# Patient Record
Sex: Female | Born: 1950 | Race: White | Hispanic: No | Smoking: Never smoker
Health system: Southern US, Community
[De-identification: ages and names within clinical notes are randomized; demographics above are authoritative.]

## PROBLEM LIST (undated history)

## (undated) DIAGNOSIS — M2550 Pain in unspecified joint: Secondary | ICD-10-CM

## (undated) DIAGNOSIS — M1990 Unspecified osteoarthritis, unspecified site: Secondary | ICD-10-CM

## (undated) DIAGNOSIS — J189 Pneumonia, unspecified organism: Secondary | ICD-10-CM

## (undated) DIAGNOSIS — E039 Hypothyroidism, unspecified: Secondary | ICD-10-CM

## (undated) DIAGNOSIS — D649 Anemia, unspecified: Secondary | ICD-10-CM

## (undated) DIAGNOSIS — Z9889 Other specified postprocedural states: Secondary | ICD-10-CM

## (undated) DIAGNOSIS — E7800 Pure hypercholesterolemia, unspecified: Secondary | ICD-10-CM

## (undated) DIAGNOSIS — T8859XA Other complications of anesthesia, initial encounter: Secondary | ICD-10-CM

## (undated) DIAGNOSIS — I10 Essential (primary) hypertension: Secondary | ICD-10-CM

## (undated) DIAGNOSIS — Z87442 Personal history of urinary calculi: Secondary | ICD-10-CM

## (undated) DIAGNOSIS — R112 Nausea with vomiting, unspecified: Secondary | ICD-10-CM

## (undated) DIAGNOSIS — T4145XA Adverse effect of unspecified anesthetic, initial encounter: Secondary | ICD-10-CM

## (undated) DIAGNOSIS — R3915 Urgency of urination: Secondary | ICD-10-CM

## (undated) DIAGNOSIS — R011 Cardiac murmur, unspecified: Secondary | ICD-10-CM

## (undated) DIAGNOSIS — Z8709 Personal history of other diseases of the respiratory system: Secondary | ICD-10-CM

## (undated) HISTORY — PX: TOTAL ABDOMINAL HYSTERECTOMY: SHX209

## (undated) HISTORY — PX: TUBAL LIGATION: SHX77

## (undated) HISTORY — DX: Pure hypercholesterolemia, unspecified: E78.00

## (undated) HISTORY — DX: Essential (primary) hypertension: I10

## (undated) HISTORY — PX: THYROID SURGERY: SHX805

---

## 2005-10-22 ENCOUNTER — Encounter (HOSPITAL_COMMUNITY): Admission: RE | Admit: 2005-10-22 | Discharge: 2005-11-21 | Payer: Self-pay | Admitting: Orthopaedic Surgery

## 2005-11-24 ENCOUNTER — Encounter (HOSPITAL_COMMUNITY): Admission: RE | Admit: 2005-11-24 | Discharge: 2005-12-24 | Payer: Self-pay | Admitting: Orthopaedic Surgery

## 2008-04-07 HISTORY — PX: OTHER SURGICAL HISTORY: SHX169

## 2009-02-27 ENCOUNTER — Ambulatory Visit (HOSPITAL_COMMUNITY): Admission: RE | Admit: 2009-02-27 | Discharge: 2009-02-27 | Payer: Self-pay | Admitting: General Surgery

## 2009-04-07 HISTORY — PX: OTHER SURGICAL HISTORY: SHX169

## 2009-04-07 HISTORY — PX: KIDNEY STONE SURGERY: SHX686

## 2009-07-14 ENCOUNTER — Emergency Department (HOSPITAL_COMMUNITY): Admission: EM | Admit: 2009-07-14 | Discharge: 2009-07-14 | Payer: Self-pay | Admitting: Emergency Medicine

## 2009-07-14 ENCOUNTER — Encounter: Payer: Self-pay | Admitting: Orthopedic Surgery

## 2009-07-16 ENCOUNTER — Ambulatory Visit: Payer: Self-pay | Admitting: Orthopedic Surgery

## 2009-07-16 DIAGNOSIS — S82839A Other fracture of upper and lower end of unspecified fibula, initial encounter for closed fracture: Secondary | ICD-10-CM | POA: Insufficient documentation

## 2009-07-16 DIAGNOSIS — Z8679 Personal history of other diseases of the circulatory system: Secondary | ICD-10-CM | POA: Insufficient documentation

## 2009-07-26 ENCOUNTER — Encounter: Payer: Self-pay | Admitting: Orthopedic Surgery

## 2009-09-10 ENCOUNTER — Ambulatory Visit: Payer: Self-pay | Admitting: Orthopedic Surgery

## 2009-09-10 DIAGNOSIS — M21619 Bunion of unspecified foot: Secondary | ICD-10-CM | POA: Insufficient documentation

## 2009-09-10 DIAGNOSIS — M1710 Unilateral primary osteoarthritis, unspecified knee: Secondary | ICD-10-CM | POA: Insufficient documentation

## 2009-09-10 DIAGNOSIS — Q742 Other congenital malformations of lower limb(s), including pelvic girdle: Secondary | ICD-10-CM | POA: Insufficient documentation

## 2009-09-10 DIAGNOSIS — IMO0002 Reserved for concepts with insufficient information to code with codable children: Secondary | ICD-10-CM

## 2009-10-25 ENCOUNTER — Emergency Department (HOSPITAL_COMMUNITY): Admission: EM | Admit: 2009-10-25 | Discharge: 2009-10-25 | Payer: Self-pay | Admitting: Emergency Medicine

## 2009-10-30 ENCOUNTER — Ambulatory Visit (HOSPITAL_COMMUNITY): Admission: RE | Admit: 2009-10-30 | Discharge: 2009-10-30 | Payer: Self-pay | Admitting: Urology

## 2010-02-06 ENCOUNTER — Telehealth: Payer: Self-pay | Admitting: Orthopedic Surgery

## 2010-02-19 ENCOUNTER — Ambulatory Visit: Payer: Self-pay | Admitting: Orthopedic Surgery

## 2010-05-07 NOTE — Letter (Signed)
Summary: History form  History form   Imported By: Jacklynn Ganong 07/17/2009 13:38:42  _____________________________________________________________________  External Attachment:    Type:   Image     Comment:   External Document

## 2010-05-07 NOTE — Assessment & Plan Note (Signed)
Summary: 8 WK RE-CK/XRAY RT FIBULA/FX CARE/AETNA/CAF   Visit Type:  Follow-up Referring Provider:  [facility]   History of Present Illness: initial history:  60 years old.  RIGHT knee pain.  Sudden onset.  Pain seems to come and go.  Pain is mild.  Mechanism of injury the patient was walking in the dark step down the knee gave.  X-rays at the hospital on the date of injury 9 April show a proximal fibula fracture and some arthritis in the RIGHT knee.  Medications: Lipitor 10mg , Synthroid 0.137mg , Avalide 300/12, ASA.  Today/  8 week recheck on right fibula with xrays after weightbearing in economy hinge brace.  reports she is improved in terms of pain in her knee area over the fracture but she says she still needs some support to walk  She then reported to complaints.  #1 RIGHT knee pain crepitance and giving way for several years worsening  #2 LEFT great toe bunion with crossover toe and hammertoe second toe    Allergies (verified): No Known Drug Allergies  Past History:  Past Medical History: Last updated: 07/16/2009 Cholesterol High blood pressure Thyroid  Past Surgical History: Last updated: 07/16/2009 Thyroid Total Abdominal Hysterectomy  Review of Systems Neurologic:  Denies numbness and tingling. Musculoskeletal:  See HPI.  Physical Exam  Additional Exam:  she is well-developed and well-nourished grooming and hygiene are normal she is short in stature  She is normal peripheral vascular findings with normal pulses and perfusion in all 4 extremities  Skin in her lower extremities normal  Neurologic exam she is awake alert and oriented x3 her mood and affect are normal.  Inspection reveals on the LEFT foot there is a bunion deformity which appears to be fixed as well as a second hammertoe with crossover deformity, the skin over the second toe is red and at the IP joint which is also fixed.  There is restricted range of motion of the great toe there is no  instability of the great toe her second toe there is restricted range of motion of the second toe.  RIGHT knee she has about 120 of flexion no flexion contracture moderate valgus alignment the knee is stable motor exam is normal lateral compartment is tenderness no joint effusion   Impression & Recommendations:  Problem # 1:  CLOSED FRACTURE OF UPPER END OF FIBULA (ICD-823.01) Assessment Improved  xrays right knee   Orders: Post-Op Check (16109) Knee x-ray,  3 views (60454) callus formed around the proximal fibular fracture with normal alignment  We do see that there is valgus osteoarthritis narrowing of the compartment varus deformity, anterior bowing of the femur.  Spur peripherally.  Problem # 2:  ARTHRITIS, RIGHT KNEE (ICD-716.96) Assessment: New  Orders: Est. Patient Level IV (09811) Depo- Medrol 40mg  (J1030) Joint Aspirate / Injection, Large (91478) Verbal consent was obtained. The knee was prepped with alcohol and ethyl chloride. 1 cc of depomedrol 40mg /cc and 4 cc of lidocaine 1% was injected. there were no complications.  Problem # 3:  HAMMER TOE (ICD-755.66) Assessment: New  Problem # 4:  BUNION, LEFT FOOT (ICD-727.1) Assessment: New  Problem # 5:  ARTHRITIS, RIGHT KNEE (ICD-716.96)  Orders: Est. Patient Level IV (29562) Depo- Medrol 40mg  (J1030) Joint Aspirate / Injection, Large (20610)  Other Orders: Est. Patient Level III (13086)  Patient Instructions: 1)  Get rid of crutches 2)  Use brace as needed 3)  Use cane as needed 4)  The fracture has healed  5)  Please schedule a follow-up appointment as needed. 6)  You have received an injection of cortisone today. You may experience increased pain at the injection site. Apply ice pack to the area for 20 minutes every 2 hours and take 2 xtra strength tylenol every 8 hours. This increased pain will usually resolve in 24 hours. The injection will take effect in 3-10 days.

## 2010-05-07 NOTE — Assessment & Plan Note (Signed)
Summary: RT KNEE PAIN NEEDS XR/AETNA/BSF   Visit Type:  Follow-up Referring Provider:  [facility]  CC:  right knee pain.  History of Present Illness: 60 year old female complaining of RIGHT knee pain x1 month. No injury. However, she did go on a walking vacation last week and has increased pain since that time. I injected her knee on June 6 of this year and that helped for quite a while until this new onset of pain, which was acute in onset associated with severe pain, lateral and medial aspect of her knee.  She did have a fibular fracture in April of this year treated conservatively, the fracture was near the proximal fibula around the neck   Medications: Lipitor 10mg , Synthroid 0.137mg , Avalide 300/12, Tylenol as needed, ASA as needed, Advil 600mg  as needed has not taken any lately, Calcium, Vitamins.        Allergies: No Known Drug Allergies   Knee Exam  General:    Well-developed, well-nourished, abnormal body habitus; no deformities, normal grooming.  Gait:    limp noted-right.    Skin:    Intact, no scars, lesions, rashes, cafe au lait spots, or bruising.    Inspection:    valgus deformity, no ecchymosis or swelling.   Palpation:    tenderness R-medial joint line and tenderness R-lateral joint line.    Vascular:    There was no swelling or varicose veins. The pulses and temperature are normal. There was no edema or tenderness.  Sensory:    Gross coordination and sensation were normal.    Motor:    Motor strength 5/5 bilaterally for quadriceps, hamstrings, ankle dorsiflexion, and ankle plantar flexion.    Knee Exam:    Right:    Inspection:  Abnormal    Palpation:  Abnormal    Stability:  stable    Range of Motion:       Flexion-Active: 120 degrees   Impression & Recommendations:  Problem # 1:  ARTHRITIS, RIGHT KNEE (ICD-716.96) Assessment Deteriorated previous xrays show anterior bowing of the distal femur with valgus compartment arthrosis and mild to  moderate valgus deformity of the RIGHT knee.   Verbal consent was obtained. The RIGHT knee was prepped with alcohol and ethyl chloride. 1 cc of depomedrol 40mg /cc and 4 cc of lidocaine 1% was injected. there were no complications.  Orders: Est. Patient Level III (16109) Joint Aspirate / Injection, Large (20610) Depo- Medrol 40mg  (J1030)  Medications Added to Medication List This Visit: 1)  Ultracet 37.5-325 Mg Tabs (Tramadol-acetaminophen) .Marland Kitchen.. 1 by mouth q 4 as needed pain  Patient Instructions: 1)  You have received an injection of cortisone today. You may experience increased pain at the injection site. Apply ice pack to the area for 20 minutes every 2 hours and take 2 xtra strength tylenol every 8 hours. This increased pain will usually resolve in 24 hours. The injection will take effect in 3-10 days.  2)  return as needed for injection/re-evaluation Prescriptions: ULTRACET 37.5-325 MG TABS (TRAMADOL-ACETAMINOPHEN) 1 by mouth q 4 as needed pain  #30 x 2   Entered and Authorized by:   Fuller Canada MD   Signed by:   Fuller Canada MD on 02/19/2010   Method used:   Handwritten   RxID:   [id number]    Orders Added: 1)  Est. Patient Level III [95621] 2)  Joint Aspirate / Injection, Large [20610] 3)  Depo- Medrol 40mg  [J1030]

## 2010-05-07 NOTE — Assessment & Plan Note (Signed)
Summary: [facility] FOL/UP/FX RT FIB/XRAY [facility] 07/14/09/AETNA/CAF   Visit Type:  Initial Consult Referring Provider:  [facility]  CC:  Right fibula fracture.  History of Present Illness: 60 years old.  RIGHT knee pain.  Sudden onset.  Pain seems to come and go.  Pain is mild.  Mechanism of injury the patient was walking in the dark step down the knee gave.  X-rays at the hospital on the date of injury 9 April show a proximal fibula fracture and some arthritis in the RIGHT knee.  Medications: Lipitor 10mg , Synthroid 0.137mg , Avalide 300/12.  Allergies (verified): No Known Drug Allergies  Past History:  Past Medical History: Cholesterol High blood pressure Thyroid  Past Surgical History: Thyroid Total Abdominal Hysterectomy  Review of Systems Constitutional:  Denies weight loss, weight gain, fever, chills, and fatigue. Cardiovascular:  Denies chest pain, palpitations, fainting, and murmurs. Respiratory:  Denies short of breath, wheezing, couch, tightness, pain on inspiration, and snoring . Gastrointestinal:  Denies heartburn, nausea, vomiting, diarrhea, constipation, and blood in your stools. Genitourinary:  Denies frequency, urgency, difficulty urinating, painful urination, flank pain, and bleeding in urine. Neurologic:  Denies numbness, tingling, unsteady gait, dizziness, tremors, and seizure. Musculoskeletal:  Complains of joint pain; denies swelling, instability, stiffness, redness, heat, and muscle pain. Endocrine:  Denies excessive thirst, exessive urination, and heat or cold intolerance. Psychiatric:  Denies nervousness, depression, anxiety, and hallucinations. Skin:  Denies changes in the skin, poor healing, rash, itching, and redness. HEENT:  Denies blurred or double vision, eye pain, redness, and watering. Immunology:  Denies seasonal allergies, sinus problems, and allergic to bee stings. Hemoatologic:  Denies easy bleeding and brusing.  Physical Exam  Additional Exam:  GEN:  well developed, well nourished, normal grooming and hygiene, no deformity and normal body habitus.   CDV: pulses are normal, no edema, no erythema. no tenderness  Lymph: normal lymph nodes   Skin: no rashes, skin lesions or open sores   NEURO: normal coordination, reflexes, sensation.   Psyche: awake, alert and oriented. Mood normal   Gait: abnormal, comes in with a wheelchair she's been walking with a straight leg brace and using a chair around her home  Inspection tenderness over the proximal fibula mild knee swelling, range of motion 90, motor exam normal, the knee is stable in the sagittal plane and the coronal plane including 0 and 30    Impression & Recommendations:  Problem # 1:  CLOSED FRACTURE OF UPPER END OF FIBULA (ICD-823.01) Assessment New  this essentially is a collateral ligament injury and we will treat it as such with a hinged knee brace  X-rays reviewed from the hospital  Orders: New Patient Level III (09811)  Patient Instructions: 1)  Fractured fibula 2)  Use walker or crutches 3)  wear brace for 8 weeks  4)  weight bera as tolerated  5)  return in 8 weeks

## 2010-05-07 NOTE — Letter (Signed)
Summary: Letter of medical necessity  Letter of medical necessity   Imported By: Jacklynn Ganong 08/15/2009 08:33:21  _____________________________________________________________________  External Attachment:    Type:   Image     Comment:   External Document

## 2010-05-07 NOTE — Progress Notes (Signed)
Summary: wants pain medicine prescription  Phone Note Call from Patient   Summary of Call: Nashea Chumney (October 31, 2050) is going on a trip and says her knee has started hurting some.  She will be walking alot and wants to know if she can get a prescription  for pain medicine.  She has an appointment  here for 02/19/10 to have her knee checked and only wants enough to get her thru until then.  She uses [facility]  Her # [phone]   Initial call taken by: Jacklynn Ganong,  February 06, 2010 1:36 PM  Follow-up for Phone Call        The fracture has healed and she has been released   Advise Tylenol 1000mg  three times a day  Follow-up by: Fuller Canada MD,  February 06, 2010 2:42 PM  Additional Follow-up for Phone Call Additional follow up Details #1::        Advised the patient of  doctor's reply Additional Follow-up by: Jacklynn Ganong,  February 07, 2010 9:34 AM

## 2010-06-22 LAB — URINALYSIS, ROUTINE W REFLEX MICROSCOPIC
Bilirubin Urine: NEGATIVE
Nitrite: NEGATIVE
Protein, ur: NEGATIVE mg/dL
pH: 7 (ref 5.0–8.0)

## 2010-06-22 LAB — CBC
HCT: 35.9 % — ABNORMAL LOW (ref 36.0–46.0)
Hemoglobin: 12.3 g/dL (ref 12.0–15.0)
MCH: 31.1 pg (ref 26.0–34.0)
MCHC: 34.4 g/dL (ref 30.0–36.0)
Platelets: 190 10*3/uL (ref 150–400)
RBC: 3.96 MIL/uL (ref 3.87–5.11)
WBC: 7.7 10*3/uL (ref 4.0–10.5)

## 2010-06-22 LAB — BASIC METABOLIC PANEL
BUN: 17 mg/dL (ref 6–23)
Calcium: 9.2 mg/dL (ref 8.4–10.5)
Chloride: 105 mEq/L (ref 96–112)
Creatinine, Ser: 0.85 mg/dL (ref 0.4–1.2)
GFR calc Af Amer: >60 mL/min (ref >60)
GFR calc non Af Amer: >60 mL/min (ref >60)

## 2010-06-22 LAB — DIFFERENTIAL: Neutro Abs: 5.9 10*3/uL (ref 1.7–7.7)

## 2010-06-22 LAB — URINE MICROSCOPIC-ADD ON

## 2010-07-01 ENCOUNTER — Other Ambulatory Visit: Payer: Self-pay | Admitting: Orthopedic Surgery

## 2010-07-01 DIAGNOSIS — M25569 Pain in unspecified knee: Secondary | ICD-10-CM

## 2010-07-22 ENCOUNTER — Encounter: Payer: Self-pay | Admitting: Orthopedic Surgery

## 2010-08-06 ENCOUNTER — Encounter: Payer: Self-pay | Admitting: Orthopedic Surgery

## 2010-08-06 ENCOUNTER — Ambulatory Visit (INDEPENDENT_AMBULATORY_CARE_PROVIDER_SITE_OTHER): Payer: Managed Care, Other (non HMO) | Admitting: Orthopedic Surgery

## 2010-08-06 DIAGNOSIS — M1710 Unilateral primary osteoarthritis, unspecified knee: Secondary | ICD-10-CM

## 2010-08-06 NOTE — Progress Notes (Signed)
[age]-year-old female with arthritis she has a severe valgus knee with a anterior bow to the femur.  She is on Ultracet and would like another injection  Repeat injection was done medial approach RIGHT knee.  Patient will return as needed or sooner if she is ready for her knee replacement.  Recommend computer-assisted placement when that time comes

## 2010-08-09 ENCOUNTER — Telehealth: Payer: Self-pay | Admitting: Orthopedic Surgery

## 2010-08-09 DIAGNOSIS — M179 Osteoarthritis of knee, unspecified: Secondary | ICD-10-CM

## 2010-08-09 DIAGNOSIS — M1710 Unilateral primary osteoarthritis, unspecified knee: Secondary | ICD-10-CM

## 2010-08-09 MED ORDER — METHYLPREDNISOLONE ACETATE 40 MG/ML IJ SUSP: 40.0000 mg | Freq: Once | INTRAMUSCULAR | Status: AC

## 2010-08-26 ENCOUNTER — Other Ambulatory Visit: Payer: Self-pay | Admitting: Orthopedic Surgery

## 2010-08-26 DIAGNOSIS — R52 Pain, unspecified: Secondary | ICD-10-CM

## 2010-09-09 ENCOUNTER — Other Ambulatory Visit: Payer: Self-pay | Admitting: *Deleted

## 2010-09-09 DIAGNOSIS — R52 Pain, unspecified: Secondary | ICD-10-CM

## 2010-09-09 MED ORDER — TRAMADOL-ACETAMINOPHEN 37.5-325 MG PO TABS: 1.0000 | ORAL_TABLET | ORAL | Status: DC | PRN

## 2010-12-23 ENCOUNTER — Other Ambulatory Visit: Payer: Self-pay | Admitting: Orthopedic Surgery

## 2010-12-23 DIAGNOSIS — R52 Pain, unspecified: Secondary | ICD-10-CM

## 2011-03-18 ENCOUNTER — Other Ambulatory Visit: Payer: Self-pay | Admitting: Orthopedic Surgery

## 2011-03-19 NOTE — Telephone Encounter (Signed)
ok 

## 2011-03-26 ENCOUNTER — Ambulatory Visit (INDEPENDENT_AMBULATORY_CARE_PROVIDER_SITE_OTHER): Payer: Managed Care, Other (non HMO) | Admitting: Orthopedic Surgery

## 2011-03-26 VITALS — BP 150/84 | Ht <= 58 in | Wt 155.0 lb

## 2011-03-26 DIAGNOSIS — M1710 Unilateral primary osteoarthritis, unspecified knee: Secondary | ICD-10-CM

## 2011-03-26 NOTE — Patient Instructions (Signed)
You have been scheduled for surgery.  All surgeries carry some risk.  Remember you always have the option of continued nonsurgical treatment. However in this situation the risks vs. the benefits favor surgery as the best treatment option. The risks of the surgery includes the following but is not limited to bleeding, infection, pulmonary embolus, death from anesthesia, nerve injury vascular injury or need for further surgery, continued pain.  Specific to this procedure the following risks and complications are rare but possible Bleeding requiring blood transfusion Deep vein thrombosis or blood clot requiring blood thinning treatment, this can become a pulmonary embolus which can be fatal Stiffness after knee replacement Continued pain after knee replacement Infection which may require multiple procedures.  If the infection cannot be eradicated amputation above the knee is needed to cure the infection. There are other complications which can arise which cannot be anticipated.  Alternative treatments to knee replacement include physical therapy, pain medications and supportive devices including braces canes walkers and crutches.  If this is unacceptable to you total knee replacement is an option to review of pain.  You have been scheduled for surgery  Please Go to your preoperative appointment and bring the folder that was given to you today  Please stop all blood thinners ibuprofen Naprosyn, aspirin, Plavix, Coumadin

## 2011-04-06 ENCOUNTER — Encounter: Payer: Self-pay | Admitting: Orthopedic Surgery

## 2011-04-06 NOTE — Progress Notes (Signed)
Patient ID: Cindy George, female   DOB: 1951-01-06, 60 y.o.   MRN: [id number] Preoperative for total knee replacement  Patient has RIGHT knee pain and the pain has become disabling for her.  She is now in her early 82s she has end-stage osteoarthritis of the knee which has worsened over the last 3-5 years.  Treatment with anti-inflammatories have not been effective in relieving her pain or inflammation.  She tried an exercise program for several months without functional improvement.  The pain continues to bother her during the day including difficulty with normal activities of daily living including climbing the stairs normal ambulation.  The knee feels unstable.  She has decided to proceed with knee replacement  X-rays today show that the RIGHT knee has end-stage osteoarthritis with varus malalignment joint space narrowing and osteophytes with subchondral sclerosis.  She also has a bow in the femur on the lateral view.  We discussed the use of computer-assisted surgery to account for her deformity.  A full history and physical will be done closer to the time of surgery  Diagnosis osteoarthritis RIGHT knee  Plan computer-assisted knee replacement surgery RIGHT lower extremity.

## 2011-05-05 ENCOUNTER — Encounter (HOSPITAL_COMMUNITY): Payer: Self-pay | Admitting: Pharmacy Technician

## 2011-05-05 ENCOUNTER — Telehealth: Payer: Self-pay | Admitting: Orthopedic Surgery

## 2011-05-05 NOTE — Telephone Encounter (Signed)
[facility], ph [phone], re: in-patient surgery, right total knee arthroplasty, CPT 27447, ICD9 719.96, 719.16.  Spoke with Ezekiel Slocumb, medical pre-cert department.  Approved for 2-day stay, authorization # [id number].  She states if additional days are needed, or if any other changes, they will need to be contacted.

## 2011-05-13 ENCOUNTER — Other Ambulatory Visit: Payer: Self-pay

## 2011-05-13 ENCOUNTER — Other Ambulatory Visit: Payer: Self-pay | Admitting: Orthopedic Surgery

## 2011-05-13 ENCOUNTER — Encounter (HOSPITAL_COMMUNITY): Payer: Self-pay

## 2011-05-13 ENCOUNTER — Encounter (HOSPITAL_COMMUNITY)
Admission: RE | Admit: 2011-05-13 | Discharge: 2011-05-13 | Disposition: A | Discharge status: Home / Self Care | Payer: Managed Care, Other (non HMO) | Source: Ambulatory Visit | Attending: Orthopedic Surgery | Admitting: Orthopedic Surgery

## 2011-05-13 DIAGNOSIS — R52 Pain, unspecified: Secondary | ICD-10-CM

## 2011-05-13 HISTORY — DX: Hypothyroidism, unspecified: E03.9

## 2011-05-13 HISTORY — DX: Unspecified osteoarthritis, unspecified site: M19.90

## 2011-05-13 LAB — PROTIME-INR: INR: 0.91 (ref 0.00–1.49)

## 2011-05-13 LAB — CBC
HCT: 37.4 % (ref 36.0–46.0)
Hemoglobin: 12.6 g/dL (ref 12.0–15.0)
MCV: 88.4 fL (ref 78.0–100.0)
RBC: 4.23 MIL/uL (ref 3.87–5.11)
WBC: 4 10*3/uL (ref 4.0–10.5)

## 2011-05-13 LAB — SURGICAL PCR SCREEN
MRSA, PCR: NEGATIVE
Staphylococcus aureus: NEGATIVE

## 2011-05-13 LAB — BASIC METABOLIC PANEL
CO2: 29 mEq/L (ref 19–32)
Chloride: 102 mEq/L (ref 96–112)
Sodium: 141 mEq/L (ref 135–145)

## 2011-05-13 MED ORDER — CHLORHEXIDINE GLUCONATE 4 % EX LIQD: 60.0000 mL | Freq: Once | CUTANEOUS | Status: DC

## 2011-05-13 NOTE — Patient Instructions (Signed)
[address] ALAJA GOLDINGER  05/13/2011   Your procedure is scheduled on:  05/19/2011  [facility] to [facility] at [id number].  Call this number if you have problems the morning of surgery: [phone]   Remember:   Do not eat food:After Midnight.  May have clear liquids:until Midnight .  Clear liquids include soda, tea, black coffee, apple or grape juice, broth.  Take these medicines the morning of surgery with A SIP OF WATER: Levothyroxine, loratadine, losartan-HCTZ. Tramadol, if needed.   Do not wear jewelry, make-up or nail polish.  Do not wear lotions, powders, or perfumes. You may wear deodorant.  Do not shave 48 hours prior to surgery.  Do not bring valuables to the hospital.  Contacts, dentures or bridgework may not be worn into surgery.  Leave suitcase in the car. After surgery it may be brought to your room.  For patients admitted to the hospital, checkout time is 11:00 AM the day of discharge.   Patients discharged the day of surgery will not be allowed to drive home.  Name and phone number of your driver: Family Special Instructions: CHG Shower Use Special Wash: 1/2 bottle night before surgery and 1/2 bottle morning of surgery.   Please read over the following fact sheets that you were given: Pain Booklet, Total Joint Packet, MRSA Information, Surgical Site Infection Prevention, Anesthesia Post-op Instructions and Care and Recovery After Surgery       PATIENT INSTRUCTIONS POST-ANESTHESIA  IMMEDIATELY FOLLOWING SURGERY:  Do not drive or operate machinery for the first twenty four hours after surgery.  Do not make any important decisions for twenty four hours after surgery or while taking narcotic pain medications or sedatives.  If you develop intractable nausea and vomiting or a severe headache please notify your doctor immediately.  FOLLOW-UP:  Please make an appointment with your surgeon as instructed. You do not need to follow up with anesthesia unless specifically instructed to do  so.  WOUND CARE INSTRUCTIONS (if applicable):  Keep a dry clean dressing on the anesthesia/puncture wound site if there is drainage.  Once the wound has quit draining you may leave it open to air.  Generally you should leave the bandage intact for twenty four hours unless there is drainage.  If the epidural site drains for more than 36-48 hours please call the anesthesia department.  QUESTIONS?:  Please feel free to call your physician or the hospital operator if you have any questions, and they will be happy to assist you.     [facility] Anesthesia Department [address] [place] [phone]    Total Knee Replacement In total knee replacement, the damaged knee is replaced with an artificial knee joint (prosthesis). The purpose of this surgery is to reduce pain and improve your range of motion. Regaining a near normal range of motion of your knee in the first 3 to 6 weeks after surgery is critical. Generally, these artificial joints last a minimum of 10 years. By that time, about 1 in 10 patients will need another surgery to repair the loose prosthesis. LET YOUR CAREGIVER KNOW ABOUT:   Allergies to food or medicine.   Medicines taken, including vitamins, herbs, eyedrops, over-the-counter medicines, and creams.   Use of steroids (by mouth or creams).   Previous problems with anesthetics or numbing medicines.   History of bleeding problems or blood clots.   Previous surgery.   Other health problems, including diabetes and kidney problems.   Possibility of pregnancy, if this applies.  RISKS AND COMPLICATIONS   Knee pain.   Loss of range of motion of the knee (stiffness) or instability.   Loosening of the prosthesis.   Infection.  BEFORE THE PROCEDURE   If you smoke, quit.   You may need a replacement or addition of blood (transfusion) during this procedure. You may want to donate your own blood for storage several weeks before the procedure. This way, your own  blood can be stored and given to you if needed. Talk to your surgeon about this option.   Do not eat or drink anything for as long as directed by your caregiver before the procedure.   You may bathe and brush your teeth before the procedure. Do not swallow the water when brushing your teeth.   Scrub the area to be operated on for 5 minutes in the morning before the procedure. Use special soap if you are directed to do so by your caregiver.   Take your regular medicines with a small sip of water unless otherwise instructed. Your caregiver will let you know if there are medicines that need to be stopped and for how long.   You should be present 60 minutes before your procedure or as directed by your caregiver.  PROCEDURE  Before surgery, an intravenous (IV) access for giving fluids will be started. You will be given medicines and/or gas to make you sleep (anesthetic). You may be given medicines in your back with a needle to make you numb from the waist down. Your surgeon will take out any damaged cartilage and bone. He or she will then put in new metal, plastic, or ceramic joint surfaces to restore alignment and function to your knee. AFTER THE PROCEDURE  You will be taken to the recovery area where a nurse will watch and check your progress. You may have a long, narrow tube (catheter) in your bladder after surgery. The catheter helps you empty your bladder (pass your urine). You may have drainage tubes coming out from under the dressing. These tubes attach to a device that removes blood or fluids that gather after surgery. Once you are awake, stable, and taking fluids well, you will be returned to your room. You will receive physical therapy as prescribed by your caregiver. If you do not have help at home, you may need to go to an extended care facility for a few weeks. If you are sent home with a continuous passive motion (CPM) machine, use it as instructed. Document Released: 06/30/2000 Document  Revised: 12/04/2010 Document Reviewed: 01/24/2009 [facility] Patient Information 2012 [place], [place].

## 2011-05-14 LAB — APTT: aPTT: 28 seconds (ref 24–37)

## 2011-05-18 NOTE — H&P (Signed)
CC: right knee pain.    Patient has RIGHT knee pain and the pain has become disabling for her. She is now in her early 35s she has end-stage osteoarthritis of the knee which has worsened over the last 3-5 years. Treatment with anti-inflammatories have not been effective in relieving her pain or inflammation. She tried an exercise program for several months without functional improvement. The pain continues to bother her during the day including difficulty with normal activities of daily living including climbing the stairs normal ambulation. The knee feels unstable.   Medications: Lipitor 10mg , Synthroid 0.137mg , Avalide 300/12, Tylenol as needed, ASA as needed, Advil 600mg  as needed has not taken any lately, Calcium, Vitamins.   Allergies:  No Known Drug Allergies   Past Medical History  Diagnosis Date  . High blood pressure   . Thyroid disorder   . High cholesterol   . Hypothyroidism   . Arthritis     Past Surgical History  Procedure Date  . Thyroid surgery   . Total abdominal hysterectomy   . Colonscopy 2010  . Kidney stone surgery 2011   ROS NORMAL     Exam  General:  Well-developed, well-nourished, abnormal body habitus; no deformities, normal grooming.   Gait:  limp noted-right.   Skin:  Intact, no scars, lesions, rashes, cafe au lait spots, or bruising.   Inspection:  valgus deformity, no ecchymosis or swelling.  Palpation:  tenderness R-medial joint line and tenderness R-lateral joint line.  Vascular:  There was no swelling or varicose veins. The pulses and temperature are normal. There was no edema or tenderness.  Sensory:  Gross coordination and sensation were normal.  Motor:  Motor strength 5/5 bilaterally for quadriceps, hamstrings, ankle dorsiflexion, and ankle plantar flexion.   Knee Exam:  Right:  Inspection: Abnormal  Palpation: Abnormal  Stability: stable  Range of Motion:  Flexion-Active: 120 degrees   Impression & Recommendations:    Problem # 1: ARTHRITIS, RIGHT KNEE (ICD-716.96)  Assessment Deteriorated  previous xrays show anterior bowing of the distal femur with valgus compartment arthrosis and mild to moderate valgus deformity of the RIGHT knee.  RT TKA WITH COMPUTER DUE TO FEMORAL BOWING

## 2011-05-19 ENCOUNTER — Encounter (HOSPITAL_COMMUNITY): Payer: Self-pay | Admitting: *Deleted

## 2011-05-19 ENCOUNTER — Inpatient Hospital Stay (HOSPITAL_COMMUNITY): Payer: Managed Care, Other (non HMO) | Admitting: Anesthesiology

## 2011-05-19 ENCOUNTER — Inpatient Hospital Stay (HOSPITAL_COMMUNITY)
Admission: RE | Admit: 2011-05-19 | Discharge: 2011-05-22 | DRG: 470 | Disposition: A | Discharge status: Home / Self Care | Payer: Managed Care, Other (non HMO) | Source: Ambulatory Visit | Attending: Orthopedic Surgery | Admitting: Orthopedic Surgery

## 2011-05-19 ENCOUNTER — Encounter (HOSPITAL_COMMUNITY): Payer: Self-pay | Admitting: Anesthesiology

## 2011-05-19 ENCOUNTER — Encounter (HOSPITAL_COMMUNITY)
Admission: RE | Disposition: A | Discharge status: Home / Self Care | Payer: Self-pay | Source: Ambulatory Visit | Attending: Orthopedic Surgery

## 2011-05-19 ENCOUNTER — Inpatient Hospital Stay (HOSPITAL_COMMUNITY): Payer: Managed Care, Other (non HMO)

## 2011-05-19 ENCOUNTER — Encounter (HOSPITAL_COMMUNITY): Payer: Self-pay

## 2011-05-19 DIAGNOSIS — Z23 Encounter for immunization: Secondary | ICD-10-CM

## 2011-05-19 DIAGNOSIS — M1710 Unilateral primary osteoarthritis, unspecified knee: Principal | ICD-10-CM | POA: Diagnosis present

## 2011-05-19 DIAGNOSIS — I10 Essential (primary) hypertension: Secondary | ICD-10-CM | POA: Diagnosis present

## 2011-05-19 DIAGNOSIS — R52 Pain, unspecified: Secondary | ICD-10-CM

## 2011-05-19 DIAGNOSIS — E039 Hypothyroidism, unspecified: Secondary | ICD-10-CM | POA: Diagnosis present

## 2011-05-19 DIAGNOSIS — Z01812 Encounter for preprocedural laboratory examination: Secondary | ICD-10-CM

## 2011-05-19 DIAGNOSIS — IMO0002 Reserved for concepts with insufficient information to code with codable children: Secondary | ICD-10-CM

## 2011-05-19 DIAGNOSIS — E7800 Pure hypercholesterolemia, unspecified: Secondary | ICD-10-CM | POA: Diagnosis present

## 2011-05-19 HISTORY — PX: TOTAL KNEE ARTHROPLASTY: SHX125

## 2011-05-19 SURGERY — ARTHROPLASTY, KNEE, TOTAL
Anesthesia: Spinal | Site: Knee | Laterality: Right | Wound class: Clean

## 2011-05-19 MED ORDER — BUPIVACAINE IN DEXTROSE 0.75-8.25 % IT SOLN
INTRATHECAL | Status: AC
  Filled 2011-05-19: qty 2

## 2011-05-19 MED ORDER — FENTANYL CITRATE 0.05 MG/ML IJ SOLN
INTRAMUSCULAR | Status: DC | PRN
  Administered 2011-05-19: 20 ug via INTRATHECAL

## 2011-05-19 MED ORDER — PROPOFOL 10 MG/ML IV EMUL
INTRAVENOUS | Status: AC
  Filled 2011-05-19: qty 20

## 2011-05-19 MED ORDER — CEFAZOLIN SODIUM 1-5 GM-% IV SOLN
INTRAVENOUS | Status: AC
  Filled 2011-05-19: qty 50

## 2011-05-19 MED ORDER — BUPIVACAINE HCL 0.75 % IJ SOLN
INTRAMUSCULAR | Status: DC | PRN
  Administered 2011-05-19: 1.5 mL via INTRATHECAL

## 2011-05-19 MED ORDER — OXYCODONE HCL 5 MG PO TABS
5.0000 mg | ORAL_TABLET | Freq: Once | ORAL | Status: AC
  Administered 2011-05-19: 5 mg via ORAL

## 2011-05-19 MED ORDER — MENTHOL 3 MG MT LOZG: 1.0000 | LOZENGE | OROMUCOSAL | Status: DC | PRN

## 2011-05-19 MED ORDER — CEFAZOLIN SODIUM 1-5 GM-% IV SOLN
INTRAVENOUS | Status: DC | PRN
  Administered 2011-05-19: 1 g via INTRAVENOUS

## 2011-05-19 MED ORDER — METHOCARBAMOL 100 MG/ML IJ SOLN
500.0000 mg | Freq: Once | INTRAVENOUS | Status: DC
  Administered 2011-05-19: 500 mg via INTRAVENOUS
  Filled 2011-05-19: qty 5

## 2011-05-19 MED ORDER — ONDANSETRON HCL 4 MG/2ML IJ SOLN: 4.0000 mg | Freq: Once | INTRAMUSCULAR | Status: DC | PRN

## 2011-05-19 MED ORDER — FENTANYL CITRATE 0.05 MG/ML IJ SOLN
25.0000 ug | INTRAMUSCULAR | Status: DC | PRN
  Administered 2011-05-19 (×2): 50 ug via INTRAVENOUS

## 2011-05-19 MED ORDER — ONDANSETRON HCL 4 MG/2ML IJ SOLN
INTRAMUSCULAR | Status: AC
  Administered 2011-05-19: 4 mg via INTRAVENOUS
  Filled 2011-05-19: qty 2

## 2011-05-19 MED ORDER — LEVOTHYROXINE SODIUM 75 MCG PO TABS
175.0000 ug | ORAL_TABLET | Freq: Every day | ORAL | Status: DC
  Administered 2011-05-20 – 2011-05-22 (×3): 175 ug via ORAL
  Filled 2011-05-19 (×4): qty 1

## 2011-05-19 MED ORDER — METHOCARBAMOL 100 MG/ML IJ SOLN
500.0000 mg | Freq: Four times a day (QID) | INTRAMUSCULAR | Status: DC | PRN
  Filled 2011-05-19: qty 5

## 2011-05-19 MED ORDER — HYDROMORPHONE HCL PF 1 MG/ML IJ SOLN
0.5000 mg | INTRAMUSCULAR | Status: DC | PRN
  Administered 2011-05-19 – 2011-05-21 (×15): 0.5 mg via INTRAVENOUS
  Filled 2011-05-19 (×15): qty 1

## 2011-05-19 MED ORDER — LIDOCAINE HCL (PF) 1 % IJ SOLN
INTRAMUSCULAR | Status: AC
  Filled 2011-05-19: qty 5

## 2011-05-19 MED ORDER — BUPIVACAINE HCL (PF) 0.25 % IJ SOLN
INTRAMUSCULAR | Status: AC
  Filled 2011-05-19: qty 90

## 2011-05-19 MED ORDER — BUPIVACAINE HCL (PF) 0.25 % IJ SOLN
INTRAMUSCULAR | Status: AC
  Filled 2011-05-19: qty 180

## 2011-05-19 MED ORDER — MIDAZOLAM HCL 2 MG/2ML IJ SOLN
INTRAMUSCULAR | Status: AC
  Administered 2011-05-19: 2 mg via INTRAVENOUS
  Filled 2011-05-19: qty 2

## 2011-05-19 MED ORDER — ONDANSETRON HCL 4 MG/2ML IJ SOLN
4.0000 mg | Freq: Once | INTRAMUSCULAR | Status: AC
  Administered 2011-05-19: 4 mg via INTRAVENOUS

## 2011-05-19 MED ORDER — MIDAZOLAM HCL 2 MG/2ML IJ SOLN
1.0000 mg | INTRAMUSCULAR | Status: DC | PRN
  Administered 2011-05-19 (×2): 2 mg via INTRAVENOUS

## 2011-05-19 MED ORDER — SIMVASTATIN 20 MG PO TABS
20.0000 mg | ORAL_TABLET | Freq: Every day | ORAL | Status: DC
  Administered 2011-05-19 – 2011-05-22 (×4): 20 mg via ORAL
  Filled 2011-05-19 (×4): qty 1

## 2011-05-19 MED ORDER — LOSARTAN POTASSIUM 50 MG PO TABS
100.0000 mg | ORAL_TABLET | Freq: Every day | ORAL | Status: DC
  Administered 2011-05-20 – 2011-05-22 (×3): 100 mg via ORAL
  Filled 2011-05-19 (×4): qty 2

## 2011-05-19 MED ORDER — BISACODYL 5 MG PO TBEC: 5.0000 mg | DELAYED_RELEASE_TABLET | Freq: Every day | ORAL | Status: DC | PRN

## 2011-05-19 MED ORDER — SODIUM CHLORIDE 0.9 % IV SOLN
INTRAVENOUS | Status: DC
  Administered 2011-05-19: 100 mL/h via INTRAVENOUS
  Administered 2011-05-20: 22:00:00 via INTRAVENOUS
  Administered 2011-05-20: 100 mL/h via INTRAVENOUS
  Administered 2011-05-20: 01:00:00 via INTRAVENOUS

## 2011-05-19 MED ORDER — FENTANYL CITRATE 0.05 MG/ML IJ SOLN
INTRAMUSCULAR | Status: AC
  Filled 2011-05-19: qty 2

## 2011-05-19 MED ORDER — ASPIRIN EC 325 MG PO TBEC
325.0000 mg | DELAYED_RELEASE_TABLET | Freq: Two times a day (BID) | ORAL | Status: DC
  Administered 2011-05-20 – 2011-05-22 (×5): 325 mg via ORAL
  Filled 2011-05-19 (×5): qty 1

## 2011-05-19 MED ORDER — MAGNESIUM CITRATE PO SOLN: 1.0000 | Freq: Once | ORAL | Status: AC | PRN

## 2011-05-19 MED ORDER — MIDAZOLAM HCL 2 MG/2ML IJ SOLN
INTRAMUSCULAR | Status: AC
  Filled 2011-05-19: qty 2

## 2011-05-19 MED ORDER — FENTANYL CITRATE 0.05 MG/ML IJ SOLN
INTRAMUSCULAR | Status: AC
  Administered 2011-05-19: 50 ug via INTRAVENOUS
  Filled 2011-05-19: qty 2

## 2011-05-19 MED ORDER — CELECOXIB 100 MG PO CAPS
ORAL_CAPSULE | ORAL | Status: AC
  Administered 2011-05-19: 400 mg via ORAL
  Filled 2011-05-19: qty 4

## 2011-05-19 MED ORDER — HYDROCHLOROTHIAZIDE 12.5 MG PO CAPS
12.5000 mg | ORAL_CAPSULE | Freq: Every day | ORAL | Status: DC
  Administered 2011-05-20 – 2011-05-22 (×3): 12.5 mg via ORAL
  Filled 2011-05-19 (×3): qty 1

## 2011-05-19 MED ORDER — PROPOFOL 10 MG/ML IV EMUL
INTRAVENOUS | Status: DC | PRN
  Administered 2011-05-19: 75 ug/kg/min via INTRAVENOUS

## 2011-05-19 MED ORDER — PROPOFOL 10 MG/ML IV EMUL: INTRAVENOUS | Status: DC | PRN

## 2011-05-19 MED ORDER — OXYCODONE HCL 5 MG PO TABS
5.0000 mg | ORAL_TABLET | ORAL | Status: AC
  Administered 2011-05-19 – 2011-05-20 (×4): 5 mg via ORAL
  Filled 2011-05-19 (×4): qty 1

## 2011-05-19 MED ORDER — SENNOSIDES-DOCUSATE SODIUM 8.6-50 MG PO TABS
1.0000 | ORAL_TABLET | Freq: Every evening | ORAL | Status: DC | PRN
  Filled 2011-05-19: qty 1

## 2011-05-19 MED ORDER — ACETAMINOPHEN 10 MG/ML IV SOLN
1000.0000 mg | Freq: Once | INTRAVENOUS | Status: AC
  Administered 2011-05-19: 1000 mg via INTRAVENOUS

## 2011-05-19 MED ORDER — LORATADINE 10 MG PO TABS: 10.0000 mg | ORAL_TABLET | Freq: Every day | ORAL | Status: DC | PRN

## 2011-05-19 MED ORDER — BUPIVACAINE ON-Q PAIN PUMP (FOR ORDER SET NO CHG)
INJECTION | Status: AC
  Filled 2011-05-19: qty 1

## 2011-05-19 MED ORDER — CELECOXIB 100 MG PO CAPS
400.0000 mg | ORAL_CAPSULE | Freq: Once | ORAL | Status: AC
  Administered 2011-05-19: 400 mg via ORAL

## 2011-05-19 MED ORDER — FENTANYL CITRATE 0.05 MG/ML IJ SOLN
INTRAMUSCULAR | Status: DC | PRN
  Administered 2011-05-19: 25 ug via INTRAVENOUS
  Administered 2011-05-19: 30 ug via INTRAVENOUS
  Administered 2011-05-19: 25 ug via INTRAVENOUS
  Administered 2011-05-19 (×2): 50 ug via INTRAVENOUS

## 2011-05-19 MED ORDER — ACETAMINOPHEN 325 MG PO TABS: 650.0000 mg | ORAL_TABLET | Freq: Four times a day (QID) | ORAL | Status: DC | PRN

## 2011-05-19 MED ORDER — ONDANSETRON HCL 4 MG/2ML IJ SOLN: 4.0000 mg | Freq: Four times a day (QID) | INTRAMUSCULAR | Status: DC | PRN

## 2011-05-19 MED ORDER — ACETAMINOPHEN 10 MG/ML IV SOLN
1000.0000 mg | Freq: Four times a day (QID) | INTRAVENOUS | Status: AC
  Administered 2011-05-19: 1000 mg via INTRAVENOUS
  Filled 2011-05-19: qty 100

## 2011-05-19 MED ORDER — CEFAZOLIN SODIUM 1-5 GM-% IV SOLN: 1.0000 g | INTRAVENOUS | Status: DC

## 2011-05-19 MED ORDER — ACETAMINOPHEN 650 MG RE SUPP: 650.0000 mg | Freq: Four times a day (QID) | RECTAL | Status: DC | PRN

## 2011-05-19 MED ORDER — METOCLOPRAMIDE HCL 5 MG/ML IJ SOLN: 5.0000 mg | Freq: Three times a day (TID) | INTRAMUSCULAR | Status: DC | PRN

## 2011-05-19 MED ORDER — DOCUSATE SODIUM 100 MG PO CAPS
100.0000 mg | ORAL_CAPSULE | Freq: Two times a day (BID) | ORAL | Status: DC
  Administered 2011-05-19 – 2011-05-22 (×6): 100 mg via ORAL
  Filled 2011-05-19 (×7): qty 1

## 2011-05-19 MED ORDER — SODIUM CHLORIDE 0.9 % IR SOLN
Status: DC | PRN
  Administered 2011-05-19: 1000 mL

## 2011-05-19 MED ORDER — LIDOCAINE HCL (CARDIAC) 10 MG/ML IV SOLN
INTRAVENOUS | Status: DC | PRN
  Administered 2011-05-19: 25 mg via INTRAVENOUS

## 2011-05-19 MED ORDER — DEXAMETHASONE SODIUM PHOSPHATE 4 MG/ML IJ SOLN
INTRAMUSCULAR | Status: AC
  Administered 2011-05-19: 4 mg via INTRAVENOUS
  Filled 2011-05-19: qty 1

## 2011-05-19 MED ORDER — ARTIFICIAL TEARS OP OINT
TOPICAL_OINTMENT | OPHTHALMIC | Status: AC
  Filled 2011-05-19: qty 3.5

## 2011-05-19 MED ORDER — LACTATED RINGERS IV SOLN
INTRAVENOUS | Status: DC | PRN
  Administered 2011-05-19 (×2): via INTRAVENOUS

## 2011-05-19 MED ORDER — METOCLOPRAMIDE HCL 10 MG PO TABS: 5.0000 mg | ORAL_TABLET | Freq: Three times a day (TID) | ORAL | Status: DC | PRN

## 2011-05-19 MED ORDER — METHOCARBAMOL 500 MG PO TABS: 500.0000 mg | ORAL_TABLET | Freq: Four times a day (QID) | ORAL | Status: DC | PRN

## 2011-05-19 MED ORDER — BUPIVACAINE-EPINEPHRINE PF 0.5-1:200000 % IJ SOLN
INTRAMUSCULAR | Status: AC
  Filled 2011-05-19: qty 20

## 2011-05-19 MED ORDER — ONDANSETRON HCL 4 MG PO TABS
4.0000 mg | ORAL_TABLET | Freq: Four times a day (QID) | ORAL | Status: DC | PRN
  Administered 2011-05-20: 4 mg via ORAL
  Filled 2011-05-19 (×2): qty 1

## 2011-05-19 MED ORDER — PHENOL 1.4 % MT LIQD: 1.0000 | OROMUCOSAL | Status: DC | PRN

## 2011-05-19 MED ORDER — PNEUMOCOCCAL VAC POLYVALENT 25 MCG/0.5ML IJ INJ
0.5000 mL | INJECTION | INTRAMUSCULAR | Status: AC
  Administered 2011-05-20: 0.5 mL via INTRAMUSCULAR
  Filled 2011-05-19: qty 0.5

## 2011-05-19 MED ORDER — BUPIVACAINE 0.25 % ON-Q PUMP SINGLE CATH 300ML
INJECTION | Status: DC | PRN
  Administered 2011-05-19: 270 mL

## 2011-05-19 MED ORDER — ACETAMINOPHEN 10 MG/ML IV SOLN
INTRAVENOUS | Status: AC
  Administered 2011-05-19: 1000 mg via INTRAVENOUS
  Filled 2011-05-19: qty 100

## 2011-05-19 MED ORDER — BUPIVACAINE-EPINEPHRINE (PF) 0.5% -1:200000 IJ SOLN
INTRAMUSCULAR | Status: DC | PRN
  Administered 2011-05-19: 60 mL

## 2011-05-19 MED ORDER — CEFAZOLIN SODIUM 1-5 GM-% IV SOLN
1.0000 g | Freq: Four times a day (QID) | INTRAVENOUS | Status: AC
  Administered 2011-05-19 – 2011-05-20 (×3): 1 g via INTRAVENOUS
  Filled 2011-05-19 (×3): qty 50

## 2011-05-19 MED ORDER — SENNA 8.6 MG PO TABS
1.0000 | ORAL_TABLET | Freq: Two times a day (BID) | ORAL | Status: DC
  Administered 2011-05-19 – 2011-05-22 (×6): 8.6 mg via ORAL
  Filled 2011-05-19 (×7): qty 1

## 2011-05-19 MED ORDER — DIPHENHYDRAMINE HCL 12.5 MG/5ML PO ELIX: 12.5000 mg | ORAL_SOLUTION | ORAL | Status: DC | PRN

## 2011-05-19 MED ORDER — ADULT MULTIVITAMIN W/MINERALS CH
1.0000 | ORAL_TABLET | Freq: Every day | ORAL | Status: DC
  Administered 2011-05-19 – 2011-05-22 (×4): 1 via ORAL
  Filled 2011-05-19 (×4): qty 1

## 2011-05-19 MED ORDER — DEXAMETHASONE SODIUM PHOSPHATE 4 MG/ML IJ SOLN
4.0000 mg | Freq: Once | INTRAMUSCULAR | Status: AC
  Administered 2011-05-19: 4 mg via INTRAVENOUS

## 2011-05-19 MED ORDER — LOSARTAN POTASSIUM-HCTZ 100-12.5 MG PO TABS: 1.0000 | ORAL_TABLET | Freq: Every day | ORAL | Status: DC

## 2011-05-19 MED ORDER — LACTATED RINGERS IV SOLN
INTRAVENOUS | Status: DC
  Administered 2011-05-19: 1000 mL via INTRAVENOUS

## 2011-05-19 SURGICAL SUPPLY — 76 items
BAG HAMPER (MISCELLANEOUS) ×2 IMPLANT
BANDAGE ELASTIC 4 VELCRO NS (GAUZE/BANDAGES/DRESSINGS) ×2 IMPLANT
BANDAGE ELASTIC 6 VELCRO NS (GAUZE/BANDAGES/DRESSINGS) ×2 IMPLANT
BANDAGE ESMARK 6X9 LF (GAUZE/BANDAGES/DRESSINGS) ×1 IMPLANT
BIT DRILL 3.2X128 (BIT) ×2 IMPLANT
BLADE HEX COATED 2.75 (ELECTRODE) ×2 IMPLANT
BLADE SAG 18X100X1.27 (BLADE) ×2 IMPLANT
BLADE SAGITTAL 25.0X1.27X90 (BLADE) ×2 IMPLANT
BLADE SAW SAG 90X13X1.27 (BLADE) ×2 IMPLANT
BNDG ESMARK 6X9 LF (GAUZE/BANDAGES/DRESSINGS) ×2
BOWL SMART MIX CTS (DISPOSABLE) IMPLANT
CATH KIT ON Q 2.5IN SLV (PAIN MANAGEMENT) ×2 IMPLANT
CEMENT HV SMART SET (Cement) ×4 IMPLANT
CLOTH BEACON ORANGE TIMEOUT ST (SAFETY) ×2 IMPLANT
COOLER CRYO CUFF IC AND MOTOR (MISCELLANEOUS) ×2 IMPLANT
COVER LIGHT HANDLE STERIS (MISCELLANEOUS) ×4 IMPLANT
COVER PROBE W GEL 5X96 (DRAPES) ×2 IMPLANT
CUFF CRYO KNEE LG 20X31 COOLER (ORTHOPEDIC SUPPLIES) IMPLANT
CUFF CRYO KNEE18X23 MED (MISCELLANEOUS) ×2 IMPLANT
CUFF TOURNIQUET SINGLE 34IN LL (TOURNIQUET CUFF) ×2 IMPLANT
CUFF TOURNIQUET SINGLE 44IN (TOURNIQUET CUFF) IMPLANT
DECANTER SPIKE VIAL GLASS SM (MISCELLANEOUS) ×4 IMPLANT
DRAPE BACK TABLE (DRAPES) ×2 IMPLANT
DRAPE EXTREMITY T 121X128X90 (DRAPE) ×2 IMPLANT
DRAPE U-SHAPE 47X51 STRL (DRAPES) ×2 IMPLANT
DRSG MEPILEX BORDER 4X12 (GAUZE/BANDAGES/DRESSINGS) ×2 IMPLANT
DRSG TEGADERM 4X4.75 (GAUZE/BANDAGES/DRESSINGS) ×2 IMPLANT
DURAPREP 26ML APPLICATOR (WOUND CARE) ×2 IMPLANT
ELECT REM PT RETURN 9FT ADLT (ELECTROSURGICAL) ×2
ELECTRODE REM PT RTRN 9FT ADLT (ELECTROSURGICAL) ×1 IMPLANT
FACESHIELD LNG OPTICON STERILE (SAFETY) ×2 IMPLANT
GLOVE BIOGEL PI IND STRL 7.0 (GLOVE) ×1 IMPLANT
GLOVE BIOGEL PI IND STRL 8 (GLOVE) ×1 IMPLANT
GLOVE BIOGEL PI IND STRL 8.5 (GLOVE) ×1 IMPLANT
GLOVE BIOGEL PI INDICATOR 7.0 (GLOVE) ×1
GLOVE BIOGEL PI INDICATOR 8 (GLOVE) ×1
GLOVE BIOGEL PI INDICATOR 8.5 (GLOVE) ×1
GLOVE ECLIPSE 6.5 STRL STRAW (GLOVE) ×2 IMPLANT
GLOVE ECLIPSE 8.0 STRL XLNG CF (GLOVE) ×2 IMPLANT
GLOVE OPTIFIT SS 8.0 STRL (GLOVE) ×2 IMPLANT
GLOVE SKINSENSE NS SZ8.0 LF (GLOVE) ×2
GLOVE SKINSENSE STRL SZ8.0 LF (GLOVE) ×2 IMPLANT
GLOVE SS BIOGEL STRL SZ 8 (GLOVE) ×1 IMPLANT
GLOVE SS N UNI LF 8.5 STRL (GLOVE) ×2 IMPLANT
GLOVE SUPERSENSE BIOGEL SZ 8 (GLOVE) ×1
GOWN STRL REIN XL XLG (GOWN DISPOSABLE) ×8 IMPLANT
HANDPIECE INTERPULSE COAX TIP (DISPOSABLE) ×1
HOOD W/PEELAWAY (MISCELLANEOUS) ×8 IMPLANT
INST SET MAJOR BONE (KITS) ×2 IMPLANT
IV NS IRRIG 3000ML ARTHROMATIC (IV SOLUTION) ×2 IMPLANT
KIT BLADEGUARD II DBL (SET/KITS/TRAYS/PACK) ×2 IMPLANT
KIT ROOM TURNOVER APOR (KITS) ×2 IMPLANT
MANIFOLD NEPTUNE II (INSTRUMENTS) ×2 IMPLANT
MARKER SKIN DUAL TIP RULER LAB (MISCELLANEOUS) ×2 IMPLANT
MARKER SPHERE PSV REFLC THRD 5 (MARKER) ×4 IMPLANT
NEEDLE HYPO 21X1.5 SAFETY (NEEDLE) ×2 IMPLANT
NS IRRIG 1000ML POUR BTL (IV SOLUTION) ×2 IMPLANT
PACK TOTAL JOINT (CUSTOM PROCEDURE TRAY) ×2 IMPLANT
PAD ARMBOARD 7.5X6 YLW CONV (MISCELLANEOUS) ×2 IMPLANT
PAD DANNIFLEX CPM (ORTHOPEDIC SUPPLIES) ×2 IMPLANT
PIN TROCAR 3 INCH (PIN) ×2 IMPLANT
PUMP ON Q 270MLX5ML/HR (PAIN MANAGEMENT) ×2 IMPLANT
SET BASIN LINEN APH (SET/KITS/TRAYS/PACK) ×2 IMPLANT
SET HNDPC FAN SPRY TIP SCT (DISPOSABLE) ×1 IMPLANT
SPONGE GAUZE 4X4 12PLY (GAUZE/BANDAGES/DRESSINGS) ×2 IMPLANT
STAPLER VISISTAT 35W (STAPLE) ×2 IMPLANT
SUT BRALON NAB BRD #1 30IN (SUTURE) ×4 IMPLANT
SUT MON AB 0 CT1 (SUTURE) ×4 IMPLANT
SUT MON AB 2-0 CT1 36 (SUTURE) ×4 IMPLANT
SYR 30ML LL (SYRINGE) ×2 IMPLANT
SYR BULB IRRIGATION 50ML (SYRINGE) ×2 IMPLANT
TOWEL OR 17X26 4PK STRL BLUE (TOWEL DISPOSABLE) ×2 IMPLANT
TOWER CARTRIDGE SMART MIX (DISPOSABLE) ×2 IMPLANT
TRAY FOLEY CATH 14FR (SET/KITS/TRAYS/PACK) ×2 IMPLANT
WATER STERILE IRR 1000ML POUR (IV SOLUTION) ×8 IMPLANT
YANKAUER SUCT 12FT TUBE ARGYLE (SUCTIONS) ×2 IMPLANT

## 2011-05-19 NOTE — Anesthesia Procedure Notes (Addendum)
Anesthesia Regional Block:   Narrative:    Spinal  Patient location during procedure: OR Start time: 05/19/2011 7:45 AM Staffing CRNA/Resident: ANDRAZA, AMY Preanesthetic Checklist Completed: patient identified, site marked, surgical consent, pre-op evaluation, timeout performed, IV checked, risks and benefits discussed and monitors and equipment checked Spinal Block Patient position: right lateral decubitus Prep: Betadine Patient monitoring: heart rate, cardiac monitor and continuous pulse ox Approach: right paramedian Location: L3-4 Injection technique: single-shot Needle Needle type: Spinocan  Needle gauge: 22 G Needle length: 9 cm Assessment Sensory level: T10 Additional Notes Bupivacaine .75% 1.5 cc Fentanyl 20 mcg and epi .1 injected intrathecally;  Patient tolerated well.  Lot [id number]  Exp 11/ 2013

## 2011-05-19 NOTE — Brief Op Note (Addendum)
05/19/2011  10:21 AM  PATIENT:  Cindy George  61 y.o. female  PRE-OPERATIVE DIAGNOSIS:  right knee osteoarthritis  POST-OPERATIVE DIAGNOSIS:  right knee osteoarthritis  PROCEDURE:  Procedure(s):  CAS RIGHT  TOTAL KNEE ARTHROPLASTY  IMPLANTS: 18F 3T 15 POLY INSERT   SURGEON:  Surgeon(s): Fuller Canada, MD  PHYSICIAN ASSISTANT:   ASSISTANTS: W. MCFATTER AND RON HARRIS    ANESTHESIA:   spinal  EBL:  Total I/O In: 1500 [I.V.:1500] Out: 300 [Urine:300]  BLOOD ADMINISTERED:none  DRAINS: PAIN PUMP CATHETER   LOCAL MEDICATIONS USED:  MARCAINE  WITH EPI   SPECIMEN:  No Specimen  DISPOSITION OF SPECIMEN:  N/A  COUNTS:  YES  TOURNIQUET:   Total Tourniquet Time Documented: Thigh (Right) - 125 minutes  DICTATION: .Reubin Milan Dictation  PLAN OF CARE: Admit to inpatient   PATIENT DISPOSITION:  PACU - hemodynamically stable.   Delay start of Pharmacological VTE agent (>24hrs) due to surgical blood loss or risk of bleeding: yes

## 2011-05-19 NOTE — Op Note (Signed)
05/19/2011  10:21 AM  PATIENT:  Cindy George  61 y.o. female  PRE-OPERATIVE DIAGNOSIS:  right knee osteoarthritis  POST-OPERATIVE DIAGNOSIS:  right knee osteoarthritis  PROCEDURE:  Procedure(s):  CAS RIGHT  TOTAL KNEE ARTHROPLASTY  IMPLANTS: 73F 3T 15 POLY INSERT   SURGEON:  Surgeon(s): Fuller Canada, MD  PHYSICIAN ASSISTANT:   ASSISTANTS: W. MCFATTER AND RON HARRIS    ANESTHESIA:   spinal  EBL:  Total I/O In: 1500 [I.V.:1500] Out: 300 [Urine:300]  BLOOD ADMINISTERED:none  DRAINS: PAIN PUMP CATHETER   LOCAL MEDICATIONS USED:  MARCAINE  WITH EPI   SPECIMEN:  No Specimen  DISPOSITION OF SPECIMEN:  N/A  COUNTS:  YES  TOURNIQUET:   Total Tourniquet Time Documented: Thigh (Right) - 125 minutes  DICTATION: .Reubin Milan Dictation  PLAN OF CARE: Admit to inpatient   PATIENT DISPOSITION:  PACU - hemodynamically stable.   Delay start of Pharmacological VTE agent (>24hrs) due to surgical blood loss or risk of bleeding: yes  Indications for procedure disabling knee pain, failure to control pain with nonoperative measures  Details of procedure:  In the preop area the patient's RIGHT  knee was marked and countersigned by the surgeon, the chart was updated, consent was signed  The patient was taken to the operating room for spinal anesthetic followed by administering Ancef based on weight  A Foley catheter was inserted sterilely, then the operative extremity  was prepped and draped sterilely  The timeout was completed  The limb was then exsanguinated with a six-inch Esmarch with the knee in flexion and the tourniquet was elevated to 300 mm of mercury. A midline incision was made, the subcutaneous tissues were divided down to the extensor mechanism. A medial arthrotomy was performed, the patella was everted,  the fat pad was resected. The medial and  lateral menisci were resected. The medial soft tissue sleeve was elevated to the mid coronal plane. The anterior  cruciate ligament and PCL were resected. Osteophytes were removed. The distal femur anterior surface was skeletonized with sharp dissection.  An incision was made over the distal tibia and 2 pins were placed in preparation for the computer-assisted surgery. 2 pins were also placed in the femur and the recognition guide were placed.  The tibia was addressed first. After satisfactory positional alignment the tibial block was pinned in place. Tibial cut was made. We matched our plan cut within 0.5 mm.  The femur was addressed second after positional alignment the femoral block was pinned in place. Distal femoral cut was made. We matched our planned cut within 0.5 mm  We then placed our verifier to arrange the femoral cut. The femur measured a 4 this was confirmed with the regular cutting block. We made a 4-in-1 cuts cleaned off the posterior condyles with osteophytes which were present. We then balanced the knee in extension and flexion with a 15 spacer block.  The box cut was made. Trials were performed. We verified the alignment and gaps.  The patella was 20 mm and still had cartilage so a patelloplasty was performed but was not resurfaced.  The tibia was punched after rotational alignment was set with range of motion check.  The knee was irrigated the bone was dried the cement was mixed the components were cemented in place. After cement cured excess cement was removed the knee was irrigated again. The 15 block was placed the knee was taken through range of motion. Passive flexion test 110 of flexion. He came to full extension. Balanced in  extension. Balanced in flexion and 90.  After thorough irrigation and injection of the soft tissues with Marcaine with epinephrine solution the extensor mechanism was repaired the patella tracked normally.  Subcutaneous tissue was closed over a pain pump catheter with 0 and 2-0 Monocryl followed by staples. The distal wound for the tibial pins was closed with  0 Monocryl and staples.  Tourniquet was released  The patient was taken recovery room where an x-ray will be obtained  Standard postop total knee protocol.

## 2011-05-19 NOTE — Transfer of Care (Signed)
Immediate Anesthesia Transfer of Care Note  Patient: Cindy George  Procedure(s) Performed:  TOTAL KNEE ARTHROPLASTY - computer assisted depuy  Patient Location: PACU  Anesthesia Type: Spinal  Level of Consciousness: awake, alert , oriented and patient cooperative  Airway & Oxygen Therapy: Patient Spontanous Breathing  Post-op Assessment: Report given to PACU RN and Post -op Vital signs reviewed and stable  Post vital signs: stable  Complications: No apparent anesthesia complications

## 2011-05-19 NOTE — Preoperative (Signed)
Beta Blockers   Reason not to administer Beta Blockers:Not Applicable 

## 2011-05-19 NOTE — Anesthesia Postprocedure Evaluation (Signed)
  Anesthesia Post-op Note  Patient: Cindy George  Procedure(s) Performed:  TOTAL KNEE ARTHROPLASTY - computer assisted depuy  Patient Location: PACU  Anesthesia Type: Spinal  Level of Consciousness: awake, alert , oriented and patient cooperative  Airway and Oxygen Therapy: Patient Spontanous Breathing  Post-op Pain: none  Post-op Assessment: Post-op Vital signs reviewed, Patient's Cardiovascular Status Stable, Respiratory Function Stable, Patent Airway, No signs of Nausea or vomiting, Pain level controlled, No headache and No backache  Post-op Vital Signs: Reviewed and stable  Complications: No apparent anesthesia complications

## 2011-05-19 NOTE — Anesthesia Preprocedure Evaluation (Addendum)
Anesthesia Evaluation  Patient identified by MRN, date of birth, ID band Patient awake    Reviewed: Allergy & Precautions, H&P , NPO status , Patient's Chart, lab work & pertinent test results  History of Anesthesia Complications (+) PONVNegative for: history of anesthetic complications  Airway Mallampati: II      Dental  (+) Teeth Intact   Pulmonary neg pulmonary ROS,  clear to auscultation        Cardiovascular hypertension, Pt. on medications Regular Normal    Neuro/Psych    GI/Hepatic negative GI ROS,   Endo/Other  Hypothyroidism   Renal/GU      Musculoskeletal   Abdominal   Peds  Hematology   Anesthesia Other Findings   Reproductive/Obstetrics                           Anesthesia Physical Anesthesia Plan  ASA: II  Anesthesia Plan: Spinal   Post-op Pain Management:    Induction: Intravenous  Airway Management Planned: Nasal Cannula  Additional Equipment:   Intra-op Plan:   Post-operative Plan:   Informed Consent: I have reviewed the patients History and Physical, chart, labs and discussed the procedure including the risks, benefits and alternatives for the proposed anesthesia with the patient or authorized representative who has indicated his/her understanding and acceptance.     Plan Discussed with:   Anesthesia Plan Comments:         Anesthesia Quick Evaluation

## 2011-05-19 NOTE — Interval H&P Note (Signed)
History and Physical Interval Note:  05/19/2011 7:24 AM  Cindy George  has presented today for surgery, with the diagnosis of right knee osteoarthritis  The various methods of treatment have been discussed with the patient and family. After consideration of risks, benefits and other options for treatment, the patient has consented to  Procedure(s):RIGHT  TOTAL KNEE ARTHROPLASTY as a surgical intervention .  The patients' history has been reviewed, patient examined, no change in status, stable for surgery.  I have reviewed the patients' chart and labs.  Questions were answered to the patient's satisfaction.     Fuller Canada

## 2011-05-20 LAB — BASIC METABOLIC PANEL
CO2: 28 mEq/L (ref 19–32)
Glucose, Bld: 106 mg/dL — ABNORMAL HIGH (ref 70–99)
Potassium: 3.2 mEq/L — ABNORMAL LOW (ref 3.5–5.1)
Sodium: 140 mEq/L (ref 135–145)

## 2011-05-20 LAB — CBC
Hemoglobin: 9.8 g/dL — ABNORMAL LOW (ref 12.0–15.0)
RBC: 3.21 MIL/uL — ABNORMAL LOW (ref 3.87–5.11)
WBC: 6.9 10*3/uL (ref 4.0–10.5)

## 2011-05-20 NOTE — Evaluation (Signed)
Physical Therapy Evaluation Patient Details Name: Cindy George MRN: [id number] DOB: Jul 06, 1950 Today's Date: 05/20/2011  Problem List:  Patient Active Problem List  Diagnoses  . ARTHRITIS, RIGHT KNEE  . BUNION, LEFT FOOT  . HAMMER TOE  . CLOSED FRACTURE OF UPPER END OF FIBULA  . HIGH BLOOD PRESSURE    Past Medical History:  Past Medical History  Diagnosis Date  . High blood pressure   . Thyroid disorder   . High cholesterol   . Hypothyroidism   . Arthritis    Past Surgical History:  Past Surgical History  Procedure Date  . Thyroid surgery   . Total abdominal hysterectomy   . Colonscopy 2010  . Kidney stone surgery 2011    PT Assessment/Plan/Recommendation PT Assessment Clinical Impression Statement: pt extremely well motivated and tolerated PT well.Marland KitchenMarland KitchenAA ROM R knee= -4 to 76 deg, min assist with transfers, ambulated 20' with walker.Marland KitchenMarland KitchenI have recommended that she not use her 4 wheeled walker initially  as it won't provide enough stability...she should transition to home at d/c with daughter's support PT Recommendation/Assessment: Patient will need skilled PT in the acute care venue PT Problem List: Decreased strength;Decreased range of motion;Decreased activity tolerance;Decreased mobility;Decreased knowledge of use of DME;Decreased knowledge of precautions;Decreased safety awareness;Pain Barriers to Discharge: None PT Therapy Diagnosis : Difficulty walking;Abnormality of gait;Generalized weakness;Acute pain PT Plan PT Frequency: 7X/week PT Treatment/Interventions: DME instruction;Gait training;Stair training;Therapeutic activities;Therapeutic exercise;Patient/family education PT Recommendation Follow Up Recommendations: Home health PT Equipment Recommended: Rolling walker with 5" wheels PT Goals  Acute Rehab PT Goals PT Goal Formulation: With patient Time For Goal Achievement: 2 weeks Pt will go Supine/Side to Sit: with supervision PT Goal: Supine/Side to Sit -  Progress: Goal set today Pt will go Sit to Supine/Side: with supervision PT Goal: Sit to Supine/Side - Progress: Goal set today Pt will go Sit to Stand: with modified independence PT Goal: Sit to Stand - Progress: Goal set today Pt will go Stand to Sit: with modified independence PT Goal: Stand to Sit - Progress: Goal set today Pt will Ambulate: 16 - 50 feet;with rolling walker;with modified independence PT Goal: Ambulate - Progress: Goal set today Pt will Go Up / Down Stairs: 1-2 stairs;with supervision;with rail(s) PT Goal: Up/Down Stairs - Progress: Goal set today  PT Evaluation Precautions/Restrictions  Precautions Required Braces or Orthoses: No Restrictions Weight Bearing Restrictions: No Other Position/Activity Restrictions: WBAT R Prior Functioning  Home Living Lives With: Alone Type of Home: House Home Layout: One level Home Access: Stairs to enter Entrance Stairs-Rails: None Entrance Stairs-Number of Steps: 2 Home Adaptive Equipment: Walker - four wheeled Additional Comments: will need a 2 wheeled walker for stability Prior Function Level of Independence: Independent with basic ADLs;Independent with homemaking with ambulation;Independent with gait;Independent with transfers Driving: Yes Vocation: Retired Producer, television/film/video: Awake/alert Overall Cognitive Status: Appears within functional limits for tasks assessed Orientation Level: Oriented X4 Sensation/Coordination Sensation Light Touch: Appears Intact Stereognosis: Not tested Hot/Cold: Not tested Proprioception: Appears Intact Coordination Gross Motor Movements are Fluid and Coordinated: Yes Extremity Assessment RUE Assessment RUE Assessment: Within Functional Limits LUE Assessment LUE Assessment: Within Functional Limits RLE Assessment RLE Assessment: Exceptions to [facility] RLE PROM (degrees) Right Knee Extension 0-130: -4  Right Knee Flexion 0-140: 76  LLE Assessment LLE Assessment:  Within Functional Limits Mobility (including Balance) Bed Mobility Bed Mobility: Yes Left Sidelying to Sit: 4: Min assist Sitting - Scoot to Edge of Bed: 6: Modified independent (Device/Increase time) Transfers Transfers: Yes Sit to  Stand: 4: Min assist Stand to Sit: 5: Supervision Ambulation/Gait Ambulation/Gait: Yes Ambulation/Gait Assistance: 4: Min assist Ambulation/Gait Assistance Details (indicate cue type and reason): cues for safe turns, correct technique Ambulation Distance (Feet): 20 Feet Assistive device: Rolling walker Gait Pattern: Step-to pattern;Decreased step length - right;Decreased stance time - right;Antalgic;Right flexed knee in stance Stairs: No Wheelchair Mobility Wheelchair Mobility: No  Posture/Postural Control Posture/Postural Control: No significant limitations Balance Balance Assessed: No Exercise  Total Joint Exercises Ankle Circles/Pumps: AROM;Both;10 reps;Supine Quad Sets: AROM;Both;10 reps;Supine Short Arc Quad: AAROM;Right;10 reps;Supine Heel Slides: AAROM;Right;10 reps;Supine End of Session PT - End of Session Equipment Utilized During Treatment: Gait belt Activity Tolerance: Patient tolerated treatment well Patient left: in chair;with call bell in reach;with family/visitor present General Behavior During Session: [facility] for tasks performed Cognition: [facility] for tasks performed  Konrad Penta 05/20/2011, 9:26 AM

## 2011-05-20 NOTE — Progress Notes (Signed)
Subjective: 1 Day Post-Op Procedure(s) (LRB): TOTAL KNEE ARTHROPLASTY (Right) Patient reports pain as moderate.    Objective: Vital signs in last 24 hours: Temp:  [97.3 F (36.3 C)-98 F (36.7 C)] 98 F (36.7 C) (02/12 2029) Pulse Rate:  [86-113] 113  (02/12 2029) Resp:  [16-20] 20  (02/12 2029) BP: (112-146)/(75-84) 146/82 mmHg (02/12 2029) SpO2:  [92 %-95 %] 92 % (02/12 2029)  Intake/Output from previous day: 02/11 0701 - 02/12 0700 In: 2861.7 [P.O.:270; I.V.:2491.7; IV Piggyback:100] Out: 2520 [Urine:2500; Blood:20] Intake/Output this shift: Total I/O In: 321.7 [I.V.:321.7] Out: 250 [Urine:250]   Basename 05/20/11 0505  HGB 9.8*    Basename 05/20/11 0505  WBC 6.9  RBC 3.21*  HCT 28.5*  PLT 182    Basename 05/20/11 0505  NA 140  K 3.2*  CL 104  CO2 28  BUN 9  CREATININE 0.54  GLUCOSE 106*  CALCIUM 9.0   No results found for this basename: LABPT:2,INR:2 in the last 72 hours  Neurologically intact Neurovascular intact Sensation intact distally Intact pulses distally Dorsiflexion/Plantar flexion intact  Assessment/Plan: 1 Day Post-Op Procedure(s) (LRB): TOTAL KNEE ARTHROPLASTY (Right) Advance diet Up with therapy  Fuller Canada 05/20/2011, 9:58 PM

## 2011-05-20 NOTE — Progress Notes (Signed)
CARE MANAGEMENT NOTE 05/20/2011  Patient:  DEMARIS, BOUSQUET   Account Number:  [id number]  Date Initiated:  05/20/2011  Documentation initiated by:  Anibal Henderson  Subjective/Objective Assessment:   Admitted following rt total knee. Pt is from home with family and will be returning home.     Action/Plan:   MD office set up [facility] with [place] and pt wants to use them. Someone has been to her house, already. Also, CPM has been set up also, with [employer]. Calls to both to verify and both are set up as pt thought.   Anticipated DC Date:  05/22/2011   Anticipated DC Plan:  HOME W HOME HEALTH SERVICES      DC Planning Services  CM consult      [facility]  HOME HEALTH  DURABLE MEDICAL EQUIPMENT   Choice offered to / List presented to:  C-1 Patient   DME arranged  CPM      DME agency  [employer]     HH arranged  HH-2 PT      [facility] agency  [facility]   Status of service:  In process, will continue to follow Medicare Important Message given?   (If response is "NO", the following Medicare IM given date fields will be blank) Date Medicare IM given:   Date Additional Medicare IM given:    Discharge Disposition:  HOME W HOME HEALTH SERVICES  Per UR Regulation:    Comments:  05/20/11 1100 Anibal Henderson RN

## 2011-05-20 NOTE — Progress Notes (Signed)
Referred to this CSW today for ?SNF. Chart reviewed- per PT notes,  patient plans to d/c home with family and HH and DME. CSW to sign off- please contact us if SW needs arise. Reece Levy, MSW, Theresia Majors [phone]

## 2011-05-20 NOTE — Addendum Note (Signed)
Addendum  created 05/20/11 1104 by Corena Pilgrim, CRNA   Modules edited:Notes Section

## 2011-05-20 NOTE — Progress Notes (Signed)
Physical Therapy Treatment Patient Details Name: Cindy George MRN: [id number] DOB: June 07, 1950 Today's Date: 05/20/2011  PT Assessment/Plan  PT - Assessment/Plan Comments on Treatment Session: progressing very well.Marland KitchenMarland KitchenROM R knee is 4-76 deg, AA...tolerating CPM well, ambulating short distances with walker PT Plan: Discharge plan remains appropriate;Frequency remains appropriate Equipment Recommended: Rolling walker with 5" wheels PT Goals  Acute Rehab PT Goals PT Goal: Sit to Supine/Side - Progress: Progressing toward goal PT Goal: Sit to Stand - Progress: Progressing toward goal PT Goal: Stand to Sit - Progress: Progressing toward goal PT Goal: Ambulate - Progress: Progressing toward goal  PT Treatment Precautions/Restrictions  Precautions Precautions: Knee Precaution Booklet Issued: No (already has one) Required Braces or Orthoses: No Restrictions Weight Bearing Restrictions: No Other Position/Activity Restrictions: WBAT Mobility (including Balance) Bed Mobility Supine to Sit: 4: Min assist Transfers Sit to Stand: 5: Supervision;From chair/3-in-1;From toilet;With upper extremity assist Sit to Stand Details (indicate cue type and reason): from bedside chair to bed Stand to Sit: 5: Supervision;With upper extremity assist;To bed;To toilet Ambulation/Gait Ambulation/Gait Assistance: 5: Supervision Ambulation/Gait Assistance Details (indicate cue type and reason): cues for correct technique Ambulation Distance (Feet): 20 Feet Assistive device: Rolling walker Stairs: No Wheelchair Mobility Wheelchair Mobility: No    Exercise  Total Joint Exercises Ankle Circles/Pumps: AROM;Both;10 reps;Supine Quad Sets: AROM;Both;10 reps;Supine Short Arc Quad: AAROM;Right;10 reps;Supine Heel Slides: AAROM;Right;10 reps;Supine End of Session PT - End of Session Equipment Utilized During Treatment: Gait belt Activity Tolerance: Patient tolerated treatment well;Patient limited by  fatigue Patient left: in CPM;in bed;with call bell in reach;with family/visitor present General Behavior During Session: [facility] for tasks performed Cognition: [facility] for tasks performed  Konrad Penta 05/20/2011, 2:20 PM

## 2011-05-20 NOTE — Evaluation (Signed)
Occupational Therapy Evaluation Patient Details Name: Cindy George MRN: [id number] DOB: April 26, 1950 Today's Date: 05/20/2011  Problem List:  Patient Active Problem List  Diagnoses  . ARTHRITIS, RIGHT KNEE  . BUNION, LEFT FOOT  . HAMMER TOE  . CLOSED FRACTURE OF UPPER END OF FIBULA  . HIGH BLOOD PRESSURE    Past Medical History:  Past Medical History  Diagnosis Date  . High blood pressure   . Thyroid disorder   . High cholesterol   . Hypothyroidism   . Arthritis    Past Surgical History:  Past Surgical History  Procedure Date  . Thyroid surgery   . Total abdominal hysterectomy   . Colonscopy 2010  . Kidney stone surgery 2011    OT Assessment/Plan/Recommendation OT Assessment Clinical Impression Statement: A: Patient is in good spirits but needs to learn trnafers with DME and AE use for dressing. Pateint is hightly motivated to return home. OT Recommendation/Assessment: Patient will need skilled OT in the acute care venue OT Problem List: Decreased knowledge of use of DME or AE;Decreased safety awareness;Pain Barriers to Discharge: None OT Therapy Diagnosis : Acute pain;Generalized weakness OT Plan OT Frequency: Min 3X/week OT Treatment/Interventions: Self-care/ADL training;Therapeutic exercise;Therapeutic activities;Modalities;Manual therapy;Patient/family education;DME and/or AE instruction OT Recommendation Follow Up Recommendations: Home health OT Equipment Recommended: Rolling walker with 5" wheels Individuals Consulted Consulted and Agree with Results and Recommendations: Patient OT Goals Acute Rehab OT Goals OT Goal Formulation: With patient ADL Goals Pt Will Perform Grooming: with set-up;Sitting, edge of bed ADL Goal: Grooming - Progress: Goal set today Pt Will Perform Lower Body Dressing: with set-up;with supervision;with modified independence;with adaptive equipment ADL Goal: Lower Body Dressing - Progress: Goal set today Pt Will Transfer to  Toilet: with modified independence;with supervision;with DME;Raised toilet seat without arms ADL Goal: Toilet Transfer - Progress: Goal set today Pt Will Perform Tub/Shower Transfer: Tub transfer;with modified independence;Grab bars;with DME ADL Goal: Tub/Shower Transfer - Progress: Goal set today  OT Evaluation Precautions/Restrictions  Precautions Precautions: Knee Required Braces or Orthoses: No Restrictions Weight Bearing Restrictions: No Other Position/Activity Restrictions: WBAT Prior Functioning Home Living Lives With: Alone Type of Home: House Home Layout: One level Home Access: Stairs to enter Entrance Stairs-Rails: None Entrance Stairs-Number of Steps: 2 Bathroom Shower/Tub: Health visitor: Standard Bathroom Accessibility: Yes How Accessible: Accessible via walker Home Adaptive Equipment: Walker - four wheeled Additional Comments: Has adapted bath and AE for dressing and brought her AE. Asked this therapist if she needd different walker therapist deferred to PT advise Prior Function Level of Independence: Independent with basic ADLs;Independent with homemaking with ambulation;Independent with gait;Independent with transfers Driving: Yes Vocation: Retired ADL ADL Statistician: Mining engineer Method: Surveyor, minerals: Grab bars Vision/Perception  Vision - History Baseline Vision: Wears glasses only for reading Patient Visual Report: No change from baseline Vision - Assessment Eye Alignment: Within Functional Limits Vision Assessment: Vision not tested [employer] Arousal/Alertness: Awake/alert Overall Cognitive Status: Appears within functional limits for tasks assessed Orientation Level: Oriented X4 Sensation/Coordination Sensation Light Touch: Appears Intact Stereognosis: Not tested Hot/Cold: Not tested Proprioception: Appears Intact Coordination Gross Motor Movements are  Fluid and Coordinated: Yes Fine Motor Movements are Fluid and Coordinated: Yes Extremity Assessment RUE Assessment RUE Assessment: Within Functional Limits LUE Assessment LUE Assessment: Within Functional Limits Mobility  Bed Mobility Bed Mobility: Yes Left Sidelying to Sit: 4: Min assist Sitting - Scoot to Edge of Bed: 6: Modified independent (Device/Increase time) Transfers Sit to Stand: 4: Min  assist Sit to Stand Details (indicate cue type and reason): from bedside chair to bed Stand to Sit: 4: Min assist Exercises Total Joint Exercises Ankle Circles/Pumps: AROM;Both;10 reps;Supine Quad Sets: AROM;Both;10 reps;Supine Short Arc Quad: AAROM;Right;10 reps;Supine Heel Slides: AAROM;Right;10 reps;Supine End of Session OT - End of Session Activity Tolerance: Patient tolerated treatment well Patient left: in bed;with call bell in reach;with family/visitor present General Behavior During Session: [facility] for tasks performed Cognition: [facility] for tasks performed   Lisa Roca OTR/L 05/20/2011, 11:45 AM

## 2011-05-20 NOTE — Anesthesia Postprocedure Evaluation (Signed)
  Anesthesia Post-op Note  Patient: Cindy George  Procedure(s) Performed: Procedure(s) (LRB): TOTAL KNEE ARTHROPLASTY (Right)  Patient Location:Room 336  Anesthesia Type: Spinal  Level of Consciousness: awake, alert , oriented and patient cooperative  Airway and Oxygen Therapy: Patient Spontanous Breathing  Post-op Pain: mild  Post-op Assessment: Post-op Vital signs reviewed, Patient's Cardiovascular Status Stable, Respiratory Function Stable and Patent Airway  Post-op Vital Signs: Reviewed and stable  Complications: No apparent anesthesia complications

## 2011-05-21 LAB — CBC
Hemoglobin: 9.3 g/dL — ABNORMAL LOW (ref 12.0–15.0)
MCH: 30.7 pg (ref 26.0–34.0)
MCV: 89.4 fL (ref 78.0–100.0)
RBC: 3.03 MIL/uL — ABNORMAL LOW (ref 3.87–5.11)
WBC: 5.6 10*3/uL (ref 4.0–10.5)

## 2011-05-21 MED ORDER — OXYCODONE-ACETAMINOPHEN 5-325 MG PO TABS
1.0000 | ORAL_TABLET | ORAL | Status: DC
  Administered 2011-05-21 – 2011-05-22 (×7): 1 via ORAL
  Filled 2011-05-21 (×7): qty 1

## 2011-05-21 MED ORDER — POLYETHYLENE GLYCOL 3350 17 G PO PACK
17.0000 g | PACK | Freq: Every day | ORAL | Status: DC
  Administered 2011-05-21 – 2011-05-22 (×2): 17 g via ORAL
  Filled 2011-05-21 (×2): qty 1

## 2011-05-21 MED ORDER — POTASSIUM CHLORIDE CRYS ER 20 MEQ PO TBCR
20.0000 meq | EXTENDED_RELEASE_TABLET | Freq: Two times a day (BID) | ORAL | Status: DC
  Administered 2011-05-21 – 2011-05-22 (×3): 20 meq via ORAL
  Filled 2011-05-21 (×3): qty 1

## 2011-05-21 NOTE — Progress Notes (Signed)
Physical Therapy Treatment Patient Details Name: Cindy George MRN: [id number] DOB: December 10, 1950 Today's Date: 05/21/2011 Charges:  therex 12', gait 10'  PT Assessment/Plan  PT - Assessment/Plan Comments on Treatment Session: Pt. medicated prior to therapy and was able to tolerate increased ambulation and therex today without pain.  Pt. returned supine secondary to had not had bath prior to getting out of bed; Pt will transfer to bed with nursing after bath. PT Plan:  Continue POC  PT Treatment Precautions/Restrictions  Precautions Precautions: Knee Precaution Booklet Issued: No (already has one) Required Braces or Orthoses: No Restrictions Weight Bearing Restrictions: Yes Other Position/Activity Restrictions: WBAT Mobility (including Balance) Bed Mobility Supine to Sit: 4: Min assist Supine to Sit Details (indicate cue type and reason): Pt requires assistance with R LE to get out/into bed Sitting - Scoot to Edge of Bed: 6: Modified independent (Device/Increase time) Transfers Sit to Stand: 5: Supervision Stand to Sit: 6: Modified independent (Device/Increase time) Ambulation/Gait Ambulation/Gait Assistance: 5: Supervision Ambulation/Gait Assistance Details (indicate cue type and reason): Cues for technique Ambulation Distance (Feet): 60 Feet Assistive device: Rolling walker Gait Pattern: Step-to pattern;Decreased step length - right;Decreased stance time - right    Exercise  Total Joint Exercises Ankle Circles/Pumps: AROM;Both;10 reps Quad Sets: AROM;Right;10 reps Heel Slides: AAROM;Right;10 reps Hip ABduction/ADduction: AAROM;Right;10 reps Straight Leg Raises: AAROM;Right;5 reps End of Session PT - End of Session Equipment Utilized During Treatment: Gait belt Activity Tolerance: Patient tolerated treatment well Patient left: in bed;with call bell in reach (Pt. requested to return to bed for bath and will get up afte) General Behavior During Session: [facility] for tasks  performed Cognition: [facility] for tasks performed  Dimitria Ketchum B. Bascom Levels, PTA 05/21/2011, 12:40 PM

## 2011-05-21 NOTE — Progress Notes (Signed)
Physical Therapy Treatment Patient Details Name: Cindy George MRN: [id number] DOB: 1950/11/02 Today's Date: 05/21/2011  PT Assessment/Plan  PT - Assessment/Plan Comments on Treatment Session: doing very well.Marland KitchenMarland KitchenROM R knee is 8-82 deg (a bit of a decrease in extension, so hamstrings were passively stretched and we were able to get nearly full extension)...plan to instruct in steps in AM PT Plan: Discharge plan remains appropriate;Frequency remains appropriate PT Goals  Acute Rehab PT Goals PT Goal: Supine/Side to Sit - Progress: Progressing toward goal PT Goal: Sit to Supine/Side - Progress: Met PT Goal: Sit to Stand - Progress: Met PT Goal: Stand to Sit - Progress: Met PT Goal: Ambulate - Progress: Met  PT Treatment Precautions/Restrictions  Precautions Precautions: Knee Precaution Booklet Issued: No (already has one) Required Braces or Orthoses: No Restrictions Weight Bearing Restrictions: Yes Other Position/Activity Restrictions: WBAT Mobility (including Balance) Bed Mobility Supine to Sit: 5: Supervision Supine to Sit Details (indicate cue type and reason): Pt requires assistance with R LE to get out/into bed Sitting - Scoot to Edge of Bed: 6: Modified independent (Device/Increase time) Sit to Supine: 5: Supervision Transfers Sit to Stand: 6: Modified independent (Device/Increase time) Stand to Sit: 6: Modified independent (Device/Increase time) Ambulation/Gait Ambulation/Gait Assistance: 5: Supervision Ambulation/Gait Assistance Details (indicate cue type and reason): Cues for technique Ambulation Distance (Feet): 20 Feet Assistive device: Rolling walker Gait Pattern: Step-to pattern;Decreased stance time - right;Decreased step length - right Stairs: No Wheelchair Mobility Wheelchair Mobility: No    Exercise  Total Joint Exercises Ankle Circles/Pumps: AROM;10 reps;Right;Supine Quad Sets: AROM;Right;10 reps;Supine Short Arc Quad: AAROM;Right;10  reps;Supine Heel Slides: AAROM;Right;10 reps;Supine Hip ABduction/ADduction: AAROM;Right;10 reps Straight Leg Raises: AAROM;Right;5 reps End of Session PT - End of Session Equipment Utilized During Treatment: Gait belt Activity Tolerance: Patient tolerated treatment well Patient left: in bed;with call bell in reach;in CPM Nurse Communication: Mobility status for transfers;Mobility status for ambulation (CPM off at 9PM) General Behavior During Session: [facility] for tasks performed Cognition: [facility] for tasks performed  Konrad Penta 05/21/2011, 1:29 PM

## 2011-05-21 NOTE — Progress Notes (Signed)
Subjective: 2 Days Post-Op Procedure(s) (LRB): TOTAL KNEE ARTHROPLASTY (Right) Patient reports pain as moderate.    Objective: Vital signs in last 24 hours: Temp:  [97.3 F (36.3 C)-98.6 F (37 C)] 98.6 F (37 C) (02/13 0513) Pulse Rate:  [96-113] 101  (02/13 0513) Resp:  [16-20] 16  (02/13 0513) BP: (117-146)/(74-82) 128/74 mmHg (02/13 0513) SpO2:  [90 %-95 %] 90 % (02/13 0513)  Intake/Output from previous day: 02/12 0701 - 02/13 0700 In: 4328.3 [P.O.:720; I.V.:3608.3] Out: 2400 [Urine:2400] Intake/Output this shift:     Basename 05/21/11 0450 05/20/11 0505  HGB 9.3* 9.8*    Basename 05/21/11 0450 05/20/11 0505  WBC 5.6 6.9  RBC 3.03* 3.21*  HCT 27.1* 28.5*  PLT 172 182    Basename 05/20/11 0505  NA 140  K 3.2*  CL 104  CO2 28  BUN 9  CREATININE 0.54  GLUCOSE 106*  CALCIUM 9.0   No results found for this basename: LABPT:2,INR:2 in the last 72 hours  Neurologically intact Sensation intact distally Intact pulses distally Dorsiflexion/Plantar flexion intact  Assessment/Plan: 2 Days Post-Op Procedure(s) (LRB): TOTAL KNEE ARTHROPLASTY (Right) Up with therapy  Fuller Canada 05/21/2011, 7:36 AM

## 2011-05-22 LAB — CBC
HCT: 27.9 % — ABNORMAL LOW (ref 36.0–46.0)
Hemoglobin: 9.7 g/dL — ABNORMAL LOW (ref 12.0–15.0)
MCHC: 34.8 g/dL (ref 30.0–36.0)
MCV: 88.3 fL (ref 78.0–100.0)
RDW: 12.1 % (ref 11.5–15.5)
WBC: 5.3 10*3/uL (ref 4.0–10.5)

## 2011-05-22 MED ORDER — ASPIRIN 325 MG PO TBEC: 325.0000 mg | DELAYED_RELEASE_TABLET | Freq: Two times a day (BID) | ORAL | Status: AC

## 2011-05-22 MED ORDER — METHOCARBAMOL 500 MG PO TABS: 500.0000 mg | ORAL_TABLET | Freq: Four times a day (QID) | ORAL | Status: AC | PRN

## 2011-05-22 MED ORDER — OXYCODONE-ACETAMINOPHEN 5-325 MG PO TABS: 1.0000 | ORAL_TABLET | ORAL | Status: AC

## 2011-05-22 MED ORDER — POTASSIUM CHLORIDE CRYS ER 20 MEQ PO TBCR: 20.0000 meq | EXTENDED_RELEASE_TABLET | Freq: Two times a day (BID) | ORAL | Status: DC

## 2011-05-22 MED ORDER — BISACODYL 5 MG PO TBEC: 5.0000 mg | DELAYED_RELEASE_TABLET | Freq: Every day | ORAL | Status: AC | PRN

## 2011-05-22 NOTE — Discharge Summary (Signed)
Physician Discharge Summary  Patient ID: Cindy George MRN: [id number] DOB/AGE: [birth year] 61 y.o.  Admit date: 05/19/2011 Discharge date: 05/22/2011  Admission Diagnoses: RIGHT KNEE OA   Discharge Diagnoses: SAME  HYPOKALEMIA  Active Problems:  * No active hospital problems. *    Discharged Condition: good  Hospital Course: Admitted on February 11 had surgery uncomplicated right total knee arthroplasty did well in therapy progressed well with her CPM machine and was discharged on the 14th in stable condition Postoperative potassium was 3.0 she was treated with supplemental oral potassium and will continue that at home Consults: None  Significant Diagnostic Studies: labs: hemoglobin potassium BMET    Component Value Date/Time   NA 140 05/20/2011 0505   K 3.0* 05/22/2011 0456   CL 104 05/20/2011 0505   CO2 28 05/20/2011 0505   GLUCOSE 106* 05/20/2011 0505   BUN 9 05/20/2011 0505   CREATININE 0.54 05/20/2011 0505   CALCIUM 9.0 05/20/2011 0505   GFRNONAA >90 05/20/2011 0505   GFRAA >90 05/20/2011 0505    CBC    Component Value Date/Time   WBC 5.3 05/22/2011 0454   RBC 3.16* 05/22/2011 0454   HGB 9.7* 05/22/2011 0454   HCT 27.9* 05/22/2011 0454   PLT 173 05/22/2011 0454   MCV 88.3 05/22/2011 0454   MCH 30.7 05/22/2011 0454   MCHC 34.8 05/22/2011 0454   RDW 12.1 05/22/2011 0454   LYMPHSABS 1.4 10/25/2009 1910   MONOABS 0.4 10/25/2009 1910   EOSABS 0.0 10/25/2009 1910   BASOSABS 0.0 10/25/2009 1910      Treatments: surgery: RT TKA DEPUY FB PS 53F  3T  15 PS INSERT CAS   Discharge Exam: Blood pressure 108/68, pulse 92, temperature 98 F (36.7 C), temperature source Oral, resp. rate 18, height 4\' 10"  (1.473 m), weight 70.761 kg (156 lb), SpO2 92.00%. General appearance: alert and cooperative Incision/Wound:CLEAN   Disposition: Final discharge disposition not confirmed  Discharge Orders    Future Appointments: Provider: Department: Dept Phone: Center:   06/02/2011 3:30 PM  Fuller Canada, MD [facility] [phone] [facility]     Future Orders Please Complete By Expires   DME Bedside commode      DME Other see comment      Comments:   Walker with wheels per PT   Diet - low sodium heart healthy      Call MD / Call 911      Comments:   If you experience chest pain or shortness of breath, CALL 911 and be transported to the hospital emergency room.  If you develope a fever above 101 F, pus (white drainage) or increased drainage or redness at the wound, or calf pain, call your surgeon's office.   Constipation Prevention      Comments:   Drink plenty of fluids.  Prune juice may be helpful.  You may use a stool softener, such as Colace (over the counter) 100 mg twice a day.  Use MiraLax (over the counter) for constipation as needed.   Increase activity slowly as tolerated      Weight Bearing as taught in Physical Therapy      Comments:   Use a walker or crutches as instructed.   Driving restrictions      Comments:   No driving for 4 weeks   CPM      Comments:   Continuous passive motion machine (CPM):      Use the CPM from 0 to 75 for 8 hours per  day.      You may increase by 10 per day.  You may break it up into 2 or 3 sessions per day.      Use CPM for 3 weeks or until you are told to stop.   TED hose      Comments:   Use stockings (TED hose) for 6 weeks on both leg(s).  You may remove them at night for sleeping.   Change dressing      Comments:   Change dressing on friday, then change the dressing daily with sterile 4 x 4 inch gauze dressing and apply TED hose.  You may clean the incision with alcohol prior to redressing.   Do not put a pillow under the knee. Place it under the heel.      Home Health      Questions: Responses:   To provide the following care/treatments PT    OT   Face-to-face encounter      Comments:   I Fuller Canada certify that this patient is under my care and that I, or a nurse practitioner or physician's assistant  working with me, had a face-to-face encounter that meets the physician face-to-face encounter requirements with this patient on 05/22/2011.       Questions: Responses:   The encounter with the patient was in whole, or in part, for the following medical condition, which is the primary reason for home health care knee replacement   I certify that, based on my findings, the following services are medically necessary home health services Physical therapy   My clinical findings support the need for the above services Post Knee Replacement with limited ambulation and strength   Further, I certify that my clinical findings support that this patient is homebound (i.e. absences from home require considerable and taxing effort and are for medical reasons or religious services or infrequently or of short duration when for other reasons) Ambulates short distances less than 300 feet   To provide the following care/treatments PT    OT     Medication List  As of 05/22/2011 12:44 PM   STOP taking these medications         ibuprofen 200 MG tablet      traMADol-acetaminophen 37.5-325 MG per tablet         TAKE these medications         aspirin 325 MG EC tablet   Take 1 tablet (325 mg total) by mouth 2 (two) times daily.      atorvastatin 10 MG tablet   Commonly known as: LIPITOR   Take 10 mg by mouth daily.      bisacodyl 5 MG EC tablet   Commonly known as: DULCOLAX   Take 1 tablet (5 mg total) by mouth daily as needed.      calcium citrate-vitamin D 200-200 MG-UNIT Tabs   Take 1 tablet by mouth 2 (two) times daily.      levothyroxine 175 MCG tablet   Commonly known as: SYNTHROID, LEVOTHROID   Take 175 mcg by mouth daily.      loratadine 10 MG tablet   Commonly known as: CLARITIN   Take 10 mg by mouth daily as needed. For allergies      losartan-hydrochlorothiazide 100-12.5 MG per tablet   Commonly known as: HYZAAR   Take 1 tablet by mouth daily.      methocarbamol 500 MG tablet    Commonly known as: ROBAXIN   Take 1 tablet (500 mg  total) by mouth every 6 (six) hours as needed.      mulitivitamin with minerals Tabs   Take 1 tablet by mouth daily.      oxyCODONE-acetaminophen 5-325 MG per tablet   Commonly known as: PERCOCET   Take 1 tablet by mouth every 4 (four) hours.      potassium chloride SA 20 MEQ tablet   Commonly known as: K-DUR,KLOR-CON   Take 1 tablet (20 mEq total) by mouth 2 (two) times daily.           Follow-up Information    Follow up on 06/02/2011.      Follow up with [facility] [id number].        No lifting x 12 weeks No driving x 2 weeks Bathing ok   Signed: Fuller Canada 05/22/2011, 12:44 PM

## 2011-05-22 NOTE — Progress Notes (Signed)
Physical Therapy Treatment Patient Details Name: Cindy George MRN: [id number] DOB: 01-Jul-1950 Today's Date: 05/22/2011 Time: 828-920 Charges: Gait x 20' Therex x 18'  PT Assessment/Plan  PT - Assessment/Plan Comments on Treatment Session: Pt toelrates tx extremely well. Pt did well with stairs on first attempt. Pt reports that she feels comfortable with stairs. Pt has two stairs to get into home. Pt presents with increased gait distance and  ROM. R knee AAROM 2-88.  PT Treatment Precautions/Restrictions  Precautions Precautions: Knee Precaution Booklet Issued: No (already has one) Required Braces or Orthoses: No Restrictions Weight Bearing Restrictions: Yes Other Position/Activity Restrictions: WBAT Mobility (including Balance) Transfers Sit to Stand: 6: Modified independent (Device/Increase time);With armrests;From chair/3-in-1 Stand to Sit: 6: Modified independent (Device/Increase time);To bed Ambulation/Gait Ambulation/Gait: Yes Ambulation/Gait Assistance: 6: Modified independent (Device/Increase time) Ambulation Distance (Feet): 140 Feet Assistive device: Rolling walker Gait Pattern: Step-to pattern;Decreased stance time - right;Decreased step length - right Stairs: Yes Stairs Assistance: 5: Supervision Stairs Assistance Details (indicate cue type and reason): Pt requires supervision and cueing for safe technique. Stair Management Technique: Two rails;Forwards Number of Stairs: 2  Height of Stairs: 6  Wheelchair Mobility Wheelchair Mobility: No    Exercise  Total Joint Exercises Ankle Circles/Pumps: AROM;10 reps;Both;Supine Quad Sets: AROM;Both;10 reps;Supine Short Arc Quad: AROM;Both;10 reps;Supine Heel Slides: Both;10 reps;AAROM;Supine Hip ABduction/ADduction: AROM;10 reps;Supine Straight Leg Raises: AROM;10 reps;Supine;Both End of Session PT - End of Session Equipment Utilized During Treatment: Gait belt;Other (comment) (RW) Activity Tolerance: Patient  tolerated treatment well Patient left: in bed;with call bell in reach Nurse Communication: Mobility status for ambulation General Behavior During Session: [facility] for tasks performed Cognition: [facility] for tasks performed  Antonieta Iba 05/22/2011, 9:29 AM

## 2011-05-22 NOTE — Progress Notes (Signed)
Patient is doing exceptionally well.  Pain is controlled on scheduled medication.  She is able to get up and ambulate to the bathroom and back to bed without problems, although we do stand-by assist her.  She has slept well tonight.

## 2011-05-23 ENCOUNTER — Encounter (HOSPITAL_COMMUNITY): Payer: Self-pay | Admitting: Orthopedic Surgery

## 2011-05-23 LAB — ABO/RH

## 2011-05-25 LAB — TYPE AND SCREEN
ABO/RH(D): AB POS
Antibody Screen: NEGATIVE
Unit division: 0

## 2011-05-27 ENCOUNTER — Telehealth: Payer: Self-pay | Admitting: Orthopedic Surgery

## 2011-05-27 NOTE — Telephone Encounter (Signed)
Received call from [place] home care nurse Tamera Punt, direct ph# [phone]; states had seen patient today at home and that wound has some yellowish drainage "not much", with a very slight greenish tint around it.  I have scheduled appointment for tomorrow 05/28/11 (as Dr. Romeo Apple is in surgery today) and have confirmed appointment with patient.  Nurse is also asking for verbal order to increase frequency from 3X to 5X for this week's home visits, which I've advised can be addressed at appointment.  Nurse also relays patient's resting heart rate today is 95.   If any other recommendations, please advise.

## 2011-05-28 ENCOUNTER — Encounter: Payer: Self-pay | Admitting: Orthopedic Surgery

## 2011-05-28 ENCOUNTER — Ambulatory Visit (INDEPENDENT_AMBULATORY_CARE_PROVIDER_SITE_OTHER): Payer: Managed Care, Other (non HMO) | Admitting: Orthopedic Surgery

## 2011-05-28 VITALS — BP 100/62 | Ht <= 58 in | Wt 156.0 lb

## 2011-05-28 DIAGNOSIS — Z96659 Presence of unspecified artificial knee joint: Secondary | ICD-10-CM

## 2011-05-28 MED ORDER — OXYCODONE-ACETAMINOPHEN 5-325 MG PO TABS: 1.0000 | ORAL_TABLET | ORAL | Status: AC | PRN

## 2011-05-28 NOTE — Progress Notes (Signed)
Patient ID: Cindy George, female   DOB: 25-Apr-1950, 61 y.o.   MRN: [id number] Chief Complaint  Patient presents with  . Wound Check    wound check, ? drainage, Rt tka 05/19/11   Computer-assisted RIGHT total knee doing well noticed some serous drainage.  At the midportion of the incision to slightly lower portion of the incision small amount of skin sutures. Drainage. Staple line looks good, maybe some staple erythema. No signs of infection.  Continue therapy. Followup Monday to get the staples out

## 2011-05-28 NOTE — Patient Instructions (Signed)
Dress as needed

## 2011-05-29 NOTE — Telephone Encounter (Signed)
This was addressed at the time of appointment, per chart, patient was seen on 05/28/11.

## 2011-06-02 ENCOUNTER — Encounter: Payer: Self-pay | Admitting: Orthopedic Surgery

## 2011-06-02 ENCOUNTER — Ambulatory Visit (INDEPENDENT_AMBULATORY_CARE_PROVIDER_SITE_OTHER): Payer: Managed Care, Other (non HMO) | Admitting: Orthopedic Surgery

## 2011-06-02 VITALS — Ht <= 58 in | Wt 156.0 lb

## 2011-06-02 DIAGNOSIS — Z96659 Presence of unspecified artificial knee joint: Secondary | ICD-10-CM

## 2011-06-02 NOTE — Patient Instructions (Signed)
Continue rehab, call office for outpatient pt orders

## 2011-06-02 NOTE — Progress Notes (Signed)
Patient ID: Cindy George, female   DOB: 1951-02-09, 61 y.o.   MRN: [id number] Chief Complaint  Patient presents with  . Follow-up    Recheck right knee.    Postop visit status post total knee arthroplasty Date of surgery  2.11.2013 Diagnosis  Osteoarthritis Operative findings OA Implant Depuy  posterior stabilized total knee replacement DVT prophylaxis Ecotrin twice a day with TED hose for 6 weeks  Living situation home Complaints none Exam Ambulation with a walker.  Knee flexion 95.  Incision clean.  No ankle edema.  Normal pulses.  Soft cast. Plan Continue home physical therapy for 8 visits and transition to outpatient therapy program

## 2011-06-10 NOTE — Progress Notes (Signed)
UR Chart Review Completedc

## 2011-06-13 ENCOUNTER — Telehealth: Payer: Self-pay | Admitting: Orthopedic Surgery

## 2011-06-13 NOTE — Telephone Encounter (Signed)
Received call from Despina Pole, physical therapist from [facility] , main ph [phone].  He is with patient, and due to missed appointment last week, requesting 1 more week of home therapy; states can accept verbal order, and written to follow.

## 2011-06-16 ENCOUNTER — Telehealth: Payer: Self-pay | Admitting: Orthopedic Surgery

## 2011-06-16 NOTE — Telephone Encounter (Signed)
approved

## 2011-06-16 NOTE — Telephone Encounter (Signed)
HYDROCODONE 7.5 Q4 PRN ENOUGH TO GET TO NEXT VISIT

## 2011-06-16 NOTE — Telephone Encounter (Signed)
Cindy George wants a refill on the Oxycodone 5/325

## 2011-06-17 ENCOUNTER — Telehealth: Payer: Self-pay | Admitting: Orthopedic Surgery

## 2011-06-17 ENCOUNTER — Other Ambulatory Visit: Payer: Self-pay | Admitting: *Deleted

## 2011-06-17 DIAGNOSIS — Z96659 Presence of unspecified artificial knee joint: Secondary | ICD-10-CM

## 2011-06-17 MED ORDER — HYDROCODONE-ACETAMINOPHEN 7.5-325 MG PO TABS: 1.0000 | ORAL_TABLET | ORAL | Status: AC | PRN

## 2011-06-17 NOTE — Telephone Encounter (Signed)
Med refill sent

## 2011-06-17 NOTE — Telephone Encounter (Signed)
Med called in

## 2011-06-17 NOTE — Telephone Encounter (Signed)
Is this ok?

## 2011-06-17 NOTE — Telephone Encounter (Signed)
Cindy George said she only has 2 more in home therapy sessions and should be ready for outpatient therapy starting Monday 06/23/11. Asked if you will send an order to [facility] so she can start next week.

## 2011-06-17 NOTE — Telephone Encounter (Signed)
Cindy George will you take care of this and call the patient?  Thanks

## 2011-06-18 NOTE — Telephone Encounter (Signed)
Cindy George, sending to you so you can get her order in

## 2011-06-18 NOTE — Telephone Encounter (Signed)
Yes you will get these on the total joints and you can give order for pt tka or tha for 6 weeks 3x a week

## 2011-06-19 NOTE — Telephone Encounter (Signed)
Nurse called in medicine

## 2011-06-19 NOTE — Telephone Encounter (Signed)
ORDER ENTERED

## 2011-06-24 NOTE — Telephone Encounter (Signed)
Per Dr. Romeo Apple and per nurse, orders for out-patient physical therapy at [facility] have been done and faxed to [phone], on 06/23/11.  Patient aware (I had left voice message for patient to call if any questions.) Appointment system indicates therapy appointment scheduled 06/30/11.

## 2011-06-30 ENCOUNTER — Ambulatory Visit (INDEPENDENT_AMBULATORY_CARE_PROVIDER_SITE_OTHER): Payer: Managed Care, Other (non HMO) | Admitting: Orthopedic Surgery

## 2011-06-30 ENCOUNTER — Ambulatory Visit (HOSPITAL_COMMUNITY)
Admission: RE | Admit: 2011-06-30 | Discharge: 2011-06-30 | Disposition: A | Discharge status: Home / Self Care | Payer: Managed Care, Other (non HMO) | Source: Ambulatory Visit | Attending: Orthopedic Surgery | Admitting: Orthopedic Surgery

## 2011-06-30 ENCOUNTER — Encounter: Payer: Self-pay | Admitting: Orthopedic Surgery

## 2011-06-30 ENCOUNTER — Other Ambulatory Visit: Payer: Self-pay | Admitting: Orthopedic Surgery

## 2011-06-30 VITALS — BP 100/64 | Ht <= 58 in | Wt 156.0 lb

## 2011-06-30 DIAGNOSIS — M25569 Pain in unspecified knee: Secondary | ICD-10-CM | POA: Insufficient documentation

## 2011-06-30 DIAGNOSIS — M25461 Effusion, right knee: Secondary | ICD-10-CM | POA: Insufficient documentation

## 2011-06-30 DIAGNOSIS — M6281 Muscle weakness (generalized): Secondary | ICD-10-CM | POA: Insufficient documentation

## 2011-06-30 DIAGNOSIS — Z96659 Presence of unspecified artificial knee joint: Secondary | ICD-10-CM

## 2011-06-30 DIAGNOSIS — IMO0001 Reserved for inherently not codable concepts without codable children: Secondary | ICD-10-CM | POA: Insufficient documentation

## 2011-06-30 DIAGNOSIS — R29898 Other symptoms and signs involving the musculoskeletal system: Secondary | ICD-10-CM | POA: Insufficient documentation

## 2011-06-30 DIAGNOSIS — M25669 Stiffness of unspecified knee, not elsewhere classified: Secondary | ICD-10-CM | POA: Insufficient documentation

## 2011-06-30 DIAGNOSIS — R262 Difficulty in walking, not elsewhere classified: Secondary | ICD-10-CM | POA: Insufficient documentation

## 2011-06-30 MED ORDER — OXYCODONE-ACETAMINOPHEN 5-325 MG PO TABS: 1.0000 | ORAL_TABLET | Freq: Four times a day (QID) | ORAL | Status: DC | PRN

## 2011-06-30 NOTE — Evaluation (Addendum)
Physical Therapy Evaluation  Patient Details  Name: Cindy George MRN: [id number] Date of Birth: [birth year]  Today's Date: 06/30/2011 Time: 1030-1120 Time Calculation (min): 50 min  Visit#: 1  of 18   Re-eval: 07/30/11 Assessment Diagnosis: Total knee Surgical Date: 05/19/11 Next MD Visit: 06/30/11 Prior Therapy: [facility]  Past Medical History:  Past Medical History  Diagnosis Date  . High blood pressure   . Thyroid disorder   . High cholesterol   . Hypothyroidism   . Arthritis    Past Surgical History:  Past Surgical History  Procedure Date  . Thyroid surgery   . Total abdominal hysterectomy   . Colonscopy 2010  . Kidney stone surgery 2011  . Total knee arthroplasty 05/19/2011    Procedure: TOTAL KNEE ARTHROPLASTY;  Surgeon: Fuller Canada, MD;  Location: [facility];  Service: Orthopedics;  Laterality: Right;  computer assisted depuy    Subjective Symptoms/Limitations Symptoms: Ms. Atayde states that she had a total knee replacement on 05/19/11.  She had a normal hospitalization course and was discharge to home.  Recieved home health services from  05/23/11 thru 06/20/11.  The patient currently is walking without her cane in the house.  Outside she will use her cane.  She is still having trouble going up and down the steps but is occasionally able to go up two to four steps reciprocally. How long can you sit comfortably?: The patient states that she is able to sit for half hour to forty minutes before she feels like she had to get up.   How long can you stand comfortably?: The patient is able to stand for about twenty minutes at a time but at that time she needs to sit down. How long can you walk comfortably?: The patient states she has walked for 20 minutes without stopping. Special Tests: The patient states that since the surgery she has been waking up four times a night.  Waking up two times a night is normal for the patient. Pain Assessment Currently in Pain?:  No/denies Pain Score: 0-No pain (Highest pain in the past week is a 5/10) Pain Location: Knee Pain Type: Surgical pain;Acute pain Pain Onset: More than a month ago Pain Frequency: Intermittent Pain Relieving Factors: Ice, elevating.  Prior Functioning  Prior Function Able to Take Stairs?: Yes Driving: Yes Vocation: Retired Leisure: Hobbies-yes (Comment) Comments: Wants to be able to ride in the car longer; get housework done faster, work out in the yard, go to movies,  Cognition/Observation Cognition Overall Cognitive Status: Appears within functional limits for tasks assessed Observation/Other Assessments Observations:  (Pt exhibits a trendelenburg gait pattern)  Sensation/Coordination/Flexibility/Functional Tests Functional Tests Functional Tests: LEFS 47/80  Assessment RLE AROM (degrees) Right Knee Extension 0-130: 5  Right Knee Flexion 0-140: 110  RLE PROM (degrees) Right Knee Extension 0-130: 0  Right Knee Flexion 0-140: 113  RLE Strength Right Hip Flexion: 5/5 Right Hip Extension: 3+/5 Right Hip ABduction: 3/5 Right Hip ADduction: 3+/5 Right Knee Flexion: 4/5 Right Knee Extension: 4/5 Right Ankle Dorsiflexion: 4/5  Exercise/Treatments   Aerobic Stationary Bike: 6' @ 2.0 seat at 6   Supine Quad Sets: Right;10 reps (pull heel back, point toe push back of knee down) Sidelying Hip ABduction: 5 reps Hip ADduction: 5 reps Prone  Hamstring Curl: 5 reps Hip Extension: 5 reps    Physical Therapy Assessment and Plan PT Assessment and Plan Clinical Impression Statement: Pt with swelling, decreased ROM, decreased strength, pain and difficulty walking who will benefit from skilled  physcial therapy to return pt to prior functional level and improve her quality of life. Rehab Potential: Good Clinical Impairments Affecting Rehab Potential: swelling, pain, weakness,  PT Duration: 6 weeks PT Treatment/Interventions: Gait training;Therapeutic activities;Therapeutic  exercise;Balance training PT Plan: see pt three times a week for six weeks.  Begin heelraise, minisquates, rockerboard standing terminal ext, supine terminal ext and clams next period.    Goals Home Exercise Program Pt will Perform Home Exercise Program: Independently PT Short Term Goals Time to Complete Short Term Goals: 2 weeks PT Short Term Goal 1: ROM to improve to 0 -115 to allow normalized heel toe gait pattern PT Short Term Goal 2: Pt to be able to sit for an hour without feeling as if she needed to get up to be able to eat out. PT Short Term Goal 3: Pain level to be no greater than a 3 PT Long Term Goals Time to Complete Long Term Goals: 4 weeks PT Long Term Goal 1: ROM to 120 to allow pt to squat to pick items off the floor. PT Long Term Goal 2: Pt strength to be improved by one grade to allow a normalized gait. Long Term Goal 3: Pt pain no greater than a 1 to allow pt to sleep waking only 2 times a night which is normal for patient. Long Term Goal 4: Pt to be able to sit for two hours without getting up to allow pt to travel to her daughter's house in [place]. PT Long Term Goal 5: Pt to be able to stand for over an hour to be able to make a large meal. Additional PT Long Term Goals?: Yes PT Long Term Goal 6: Pt to be able to be on her feet for over two hours with walking/ standing and stooping to be able to complete housework in one setting.  Problem List Patient Active Problem List  Diagnoses  . ARTHRITIS, RIGHT KNEE  . BUNION, LEFT FOOT  . HAMMER TOE  . CLOSED FRACTURE OF UPPER END OF FIBULA  . HIGH BLOOD PRESSURE  . S/P total knee replacement  . Stiffness of joint, not elsewhere classified, lower leg  . Swelling of joint of right knee  . Difficulty in walking  . Weakness of right leg    PT - End of Session Activity Tolerance: Patient tolerated treatment well General Behavior During Session: [facility] for tasks performed Cognition: [facility] for tasks performed PT Plan of  Care PT Home Exercise Plan: given Consulted and Agree with Plan of Care: Patient    Lacoya Wilbanks,CINDY 06/30/2011, 11:34 AM  Physician Documentation Your signature is required to indicate approval of the treatment plan as stated above.  Please sign and either send electronically or make a copy of this report for your files and return this physician signed original.   Please mark one 1.__approve of plan  2. ___approve of plan with the following conditions.   ______________________________                                                          _____________________ Physician Signature  Date  

## 2011-06-30 NOTE — Progress Notes (Signed)
Patient ID: Cindy George, female   DOB: 1950-04-11, 61 y.o.   MRN: [id number] Chief Complaint  Patient presents with  . Follow-up    4 week recheck on right TKA.   Doing well   Having some discomfort at night and with exercise described as soreness. Activity without support and light shopping.  No devices.  Flexion extension 105.  Continue therapy. Return in 6 weeks

## 2011-06-30 NOTE — Patient Instructions (Addendum)
Home exercise program   Stop aspirin

## 2011-07-01 ENCOUNTER — Ambulatory Visit (HOSPITAL_COMMUNITY): Payer: Managed Care, Other (non HMO)

## 2011-07-01 NOTE — Telephone Encounter (Signed)
APPROVED FOR 84 TABS 2 REFILLS

## 2011-07-02 ENCOUNTER — Ambulatory Visit (HOSPITAL_COMMUNITY)
Admission: RE | Admit: 2011-07-02 | Discharge: 2011-07-02 | Disposition: A | Discharge status: Home / Self Care | Payer: Managed Care, Other (non HMO) | Source: Ambulatory Visit | Attending: Family Medicine | Admitting: Family Medicine

## 2011-07-02 NOTE — Progress Notes (Signed)
Physical Therapy Treatment Patient Details  Name: Cindy George MRN: [id number] Date of Birth: [birth year]  Today's Date: 07/02/2011 Time: 1308-6578 Time Calculation (min): 64 min Visit#: 2  of 18   Re-eval: 07/30/11 Charges: Therex x 42' Ice x 10'   Subjective: Symptoms/Limitations Symptoms: Pt states it feels tight and sore. Pain Assessment Currently in Pain?: Yes Pain Score:   2 Pain Location: Knee Pain Orientation: Right  Exercise/Treatments Aerobic Stationary Bike: 6' @ 2.0 seat at 6 Standing Heel Raises: 10 reps;Limitations Heel Raises Limitations: toe raises x 10 Terminal Knee Extension: 10 reps;Right;Theraband Theraband Level (Terminal Knee Extension): Level 4 (Blue) Rocker Board: 1 minute Supine Quad Sets: 10 reps Short Arc Quad Sets: 10 reps Straight Leg Raises: 10 reps Sidelying Hip ABduction: 5 reps Hip ADduction: 10 reps Prone  Hamstring Curl: 10 reps Hip Extension: 10 reps  Ice to R knee x 10'   Physical Therapy Assessment and Plan PT Assessment and Plan Clinical Impression Statement: Pt completes therex well. Pt requires multimodal cueing with prone hip ext to avoid lumbar mm activation. Pt requires vc's to reduce speed with exercises. Ice applied to knee to limit pain and swelling. Pt reports 0/10 pain at end of session. PT Plan: Continue to progress per PT POC. Begin forward and lateral step-ups next session.    Problem List Patient Active Problem List  Diagnoses  . ARTHRITIS, RIGHT KNEE  . BUNION, LEFT FOOT  . HAMMER TOE  . CLOSED FRACTURE OF UPPER END OF FIBULA  . HIGH BLOOD PRESSURE  . S/P total knee replacement  . Stiffness of joint, not elsewhere classified, lower leg  . Swelling of joint of right knee  . Difficulty in walking  . Weakness of right leg    PT - End of Session Activity Tolerance: Patient tolerated treatment well General Behavior During Session: [facility] for tasks performed Cognition: [facility] for tasks  performed  Seth Bake, PTA 07/02/2011, 11:53 AM

## 2011-07-08 ENCOUNTER — Ambulatory Visit (HOSPITAL_COMMUNITY)
Admission: RE | Admit: 2011-07-08 | Discharge: 2011-07-08 | Disposition: A | Discharge status: Home / Self Care | Payer: Managed Care, Other (non HMO) | Source: Ambulatory Visit | Attending: Family Medicine | Admitting: Family Medicine

## 2011-07-08 DIAGNOSIS — M25569 Pain in unspecified knee: Secondary | ICD-10-CM | POA: Insufficient documentation

## 2011-07-08 DIAGNOSIS — M25669 Stiffness of unspecified knee, not elsewhere classified: Secondary | ICD-10-CM | POA: Insufficient documentation

## 2011-07-08 DIAGNOSIS — R262 Difficulty in walking, not elsewhere classified: Secondary | ICD-10-CM | POA: Insufficient documentation

## 2011-07-08 DIAGNOSIS — M6281 Muscle weakness (generalized): Secondary | ICD-10-CM | POA: Insufficient documentation

## 2011-07-08 DIAGNOSIS — IMO0001 Reserved for inherently not codable concepts without codable children: Secondary | ICD-10-CM | POA: Insufficient documentation

## 2011-07-08 NOTE — Progress Notes (Signed)
Physical Therapy Treatment Patient Details  Name: Cindy George MRN: [id number] Date of Birth: [birth year]  Today's Date: 07/08/2011 Time: 0802-0850 Time Calculation (min): 48 min Visit#: 4  of 18   Re-eval: 07/30/11 Charges:  therex 32', ice 10'    Subjective: Symptoms/Limitations Symptoms: Pt. states she cooked for 50 people on Sunday and is a little sore today, no pain just soreness and stiffness in R knee. Pain Assessment Currently in Pain?: No/denies  Exercises Instructed by Trilby Leaver, SPTA under the direction of Albertina Leise Bascom Levels, PTA/CI. Exercise/Treatments Aerobic Stationary Bike: 6' @ 2.5 seat at 6 Standing Heel Raises: 15 reps Heel Raises Limitations: toe raises x 15 Terminal Knee Extension: 15 reps Theraband Level (Terminal Knee Extension): Level 4 (Blue) Rocker Board: 2 minutes Supine Quad Sets: 10 reps Short Arc Quad Sets: 10 reps Straight Leg Raises: 10 reps Sidelying Hip ABduction: 10 reps Hip ADduction: 15 reps Prone  Hamstring Curl: 15 reps Hip Extension: 15 reps   Modalities Modalities: Cryotherapy Cryotherapy Number Minutes Cryotherapy: 10 Minutes Cryotherapy Location: Knee Type of Cryotherapy: Ice pack  Physical Therapy Assessment and Plan PT Assessment and Plan Clinical Impression Statement: Able to increase most reps of therex without difficulty.  Pt. reported no increase/decrese in pain after treatment. PT Plan: Begin forward and lateral steps ups next visit. Increase reps to 20 and add weights when able.     Problem List Patient Active Problem List  Diagnoses  . ARTHRITIS, RIGHT KNEE  . BUNION, LEFT FOOT  . HAMMER TOE  . CLOSED FRACTURE OF UPPER END OF FIBULA  . HIGH BLOOD PRESSURE  . S/P total knee replacement  . Stiffness of joint, not elsewhere classified, lower leg  . Swelling of joint of right knee  . Difficulty in walking  . Weakness of right leg    PT - End of Session Activity Tolerance: Patient tolerated  treatment well General Behavior During Session: [facility] for tasks performed Cognition: [facility] for tasks performed    Trilby Leaver, SPTA/ Hafsah Hendler B. Bascom Levels, PTA 07/08/2011, 9:25 AM

## 2011-07-10 ENCOUNTER — Ambulatory Visit (HOSPITAL_COMMUNITY)
Admission: RE | Admit: 2011-07-10 | Discharge: 2011-07-10 | Disposition: A | Discharge status: Home / Self Care | Payer: Managed Care, Other (non HMO) | Source: Ambulatory Visit | Attending: Family Medicine | Admitting: Family Medicine

## 2011-07-10 DIAGNOSIS — R29898 Other symptoms and signs involving the musculoskeletal system: Secondary | ICD-10-CM

## 2011-07-10 DIAGNOSIS — R262 Difficulty in walking, not elsewhere classified: Secondary | ICD-10-CM

## 2011-07-10 DIAGNOSIS — M25669 Stiffness of unspecified knee, not elsewhere classified: Secondary | ICD-10-CM

## 2011-07-10 DIAGNOSIS — M25461 Effusion, right knee: Secondary | ICD-10-CM

## 2011-07-10 NOTE — Progress Notes (Signed)
Physical Therapy Treatment Patient Details  Name: Cindy George MRN: [id number] Date of Birth: 16-Jun-1950  Today's Date: 07/10/2011 Time: 0802-0900 Time Calculation (min): 58 min Visit#: 5  of 18   Re-eval: 07/30/11 Charges:  therex 42', Ice 10'    Subjective: Symptoms/Limitations Symptoms: Pt stated she did not do much yesterday right knee really sore on lateral aspect of R knee, really stiff today. Pain Assessment Currently in Pain?: No/denies  Exercises Instructed by Trilby Leaver, SPTA under the direction of [place] Bascom George, PTA/CI. Exercise/Treatments Aerobic Stationary Bike: 6' @ 2.5 seat at 5 Standing Heel Raises: 15 reps Heel Raises Limitations: toe raises x 15 Terminal Knee Extension: 15 reps Theraband Level (Terminal Knee Extension): Level 4 (Blue) Rocker Board: 3 minutes Supine Quad Sets: 15 reps Short Arc Quad Sets: 15 reps Heel Slides: 10 reps;Limitations Heel Slides Limitations: AAROM in end motion Straight Leg Raises: 15 reps Sidelying Hip ABduction: 15 reps Hip ADduction: 15 reps Prone  Hamstring Curl: 15 reps Hip Extension: 15 reps   Modalities Modalities: Cryotherapy Cryotherapy Number Minutes Cryotherapy: 10 Minutes Cryotherapy Location: Knee (Right) Type of Cryotherapy: Ice pack  Physical Therapy Assessment and Plan PT Assessment and Plan Clinical Impression Statement: Added heelslides with manual assist in end range to increase ROM.  Able to increase reps of mat activities without difficulty and added 2" step ups without difficulty so will try to increase to 4" next visit.Marland Kitchen PT Plan: Continue to progress strength and increase to 4" step next visit.     PT - End of Session Activity Tolerance: Patient tolerated treatment well General Behavior During Session: [facility] for tasks performed Cognition: [facility] for tasks performed  Trilby Leaver, SPTA/ Cindy George, PTA 07/10/2011, 9:38 AM

## 2011-07-14 ENCOUNTER — Ambulatory Visit (HOSPITAL_COMMUNITY)
Admission: RE | Admit: 2011-07-14 | Discharge: 2011-07-14 | Disposition: A | Discharge status: Home / Self Care | Payer: Managed Care, Other (non HMO) | Source: Ambulatory Visit | Attending: Family Medicine | Admitting: Family Medicine

## 2011-07-14 NOTE — Progress Notes (Signed)
Physical Therapy Treatment Patient Details  Name: MCKYNLIE VANDERSLICE MRN: [id number] Date of Birth: [birth year]  Today's Date: 07/14/2011 Time: 1105-1203 Time Calculation (min): 58 min Visit#: 6  of 18   Re-eval: 07/30/11 Charges: Gait training x 10' Therex x 30' Ice x 10'  Subjective: Symptoms/Limitations Symptoms: Pt reports that she will be going to a freidsn house of has 4-5 statirs going in and no handrail.  Pain Assessment Currently in Pain?: No/denies Pain Score: 0-No pain  Precautions/Restrictions     Exercise/Treatments Mobility/Balance        Stretches   Aerobic Stationary Bike: 6' @ 2 seat at [employer] for Strengthening   Plyometrics   Standing Heel Raises: 20 reps Heel Raises Limitations: toe raises x 20 Terminal Knee Extension: 20 reps Theraband Level (Terminal Knee Extension): Level 4 (Blue) Lateral Step Up: 10 reps;Step Height: 4";Left Forward Step Up: 10 reps;Step Height: 4";Left Step Down: 10 reps;Step Height: 4";Left Stairs: 2 1/2 RT Rocker Board: 3 minutes SLS: R 5" max of 3 Seated   Supine   Sidelying   Prone      Modalities Modalities: Cryotherapy Cryotherapy Number Minutes Cryotherapy: 10 Minutes Cryotherapy Location: Knee (Right) Type of Cryotherapy: Ice pack  Physical Therapy Assessment and Plan PT Assessment and Plan Clinical Impression Statement: Tx focus on functional strength and balance. Progressed step ups to 4" step with minimal difficulty. Pt has difficulty on stairs when not using HR yet when using HHA does not utilize assistance to complete activity. Difficulty appears to come from apprehension rather than weakness. Pt advised to have someone with her for HHA when ascending /descending stairs without HR. Mat exercises not completed secondary to time. Ice applied to R knee to limit inflammation/soreness. Pt reports 0/10 pain at end of session. PT Plan: Continue to progress per PT POC.     Problem List Patient Active  Problem List  Diagnoses  . ARTHRITIS, RIGHT KNEE  . BUNION, LEFT FOOT  . HAMMER TOE  . CLOSED FRACTURE OF UPPER END OF FIBULA  . HIGH BLOOD PRESSURE  . S/P total knee replacement  . Stiffness of joint, not elsewhere classified, lower leg  . Swelling of joint of right knee  . Difficulty in walking  . Weakness of right leg    PT - End of Session Activity Tolerance: Patient tolerated treatment well General Behavior During Session: [facility] for tasks performed Cognition: [facility] for tasks performed    Seth Bake, PTA 07/14/2011, 12:11 PM

## 2011-07-16 ENCOUNTER — Ambulatory Visit (HOSPITAL_COMMUNITY)
Admission: RE | Admit: 2011-07-16 | Discharge: 2011-07-16 | Disposition: A | Discharge status: Home / Self Care | Payer: Managed Care, Other (non HMO) | Source: Ambulatory Visit | Attending: Family Medicine | Admitting: Family Medicine

## 2011-07-16 NOTE — Progress Notes (Signed)
Physical Therapy Treatment Patient Details  Name: Cindy George MRN: [id number] Date of Birth: 06-30-1950  Today's Date: 07/16/2011 Time: 7829-5621 Time Calculation (min): 47 min Visit#: 7  of 18   Re-eval: 07/30/11 Charges: Therex x 39'  Subjective: Symptoms/Limitations Symptoms: Pt reports that she is not having any pain. She says she is a little sore in both legs from mowing her yard with a riding lawn mower yesterday. Pain Assessment Currently in Pain?: No/denies   Exercise/Treatments Stretches Quad Stretch: 2 reps;30 seconds Aerobic Stationary Bike: 6' @ 2.0 seat at 5 Standing Terminal Knee Extension: 20 reps Theraband Level (Terminal Knee Extension): Level 4 (Blue) Lateral Step Up: 10 reps;Step Height: 4";Right Forward Step Up: 10 reps;Step Height: 4";Right Step Down: 10 reps;Step Height: 4";Right Stairs: 2RT Rocker Board: 3 minutes SLS: R 6" max of 3 Supine Straight Leg Raises: 15 reps Sidelying Hip ABduction: 15 reps Hip ADduction: 15 reps Prone  Hamstring Curl: 20 reps Hip Extension: 20 reps   Physical Therapy Assessment and Plan PT Assessment and Plan Clinical Impression Statement: Pt able to complete reciprocal pattern ascending stairs with one handrail. Pt requires 2 hand rails when descending stairs reciprocally secondary to decrease eccentric quad control. Pt reports no change in pain at end of session. Pt states that she plans to ice her knee at home. PT Plan: Continue to progress per PT POC. Add weights to mat exercises and begin gait training on TM to normalize gait pattern next session.     Problem List Patient Active Problem List  Diagnoses  . ARTHRITIS, RIGHT KNEE  . BUNION, LEFT FOOT  . HAMMER TOE  . CLOSED FRACTURE OF UPPER END OF FIBULA  . HIGH BLOOD PRESSURE  . S/P total knee replacement  . Stiffness of joint, not elsewhere classified, lower leg  . Swelling of joint of right knee  . Difficulty in walking  . Weakness of right leg     PT - End of Session Activity Tolerance: Patient tolerated treatment well General Behavior During Session: [facility] for tasks performed Cognition: [facility] for tasks performed   Seth Bake, PTA 07/16/2011, 8:52 AM

## 2011-07-18 ENCOUNTER — Ambulatory Visit (HOSPITAL_COMMUNITY): Payer: Managed Care, Other (non HMO)

## 2011-07-21 ENCOUNTER — Ambulatory Visit (HOSPITAL_COMMUNITY)
Admission: RE | Admit: 2011-07-21 | Discharge: 2011-07-21 | Disposition: A | Discharge status: Home / Self Care | Payer: Managed Care, Other (non HMO) | Source: Ambulatory Visit | Attending: Family Medicine | Admitting: Family Medicine

## 2011-07-21 DIAGNOSIS — R29898 Other symptoms and signs involving the musculoskeletal system: Secondary | ICD-10-CM

## 2011-07-21 DIAGNOSIS — R262 Difficulty in walking, not elsewhere classified: Secondary | ICD-10-CM

## 2011-07-21 DIAGNOSIS — M25461 Effusion, right knee: Secondary | ICD-10-CM

## 2011-07-21 DIAGNOSIS — M25669 Stiffness of unspecified knee, not elsewhere classified: Secondary | ICD-10-CM

## 2011-07-21 NOTE — Evaluation (Signed)
Physical Therapy Evaluation  Patient Details  Name: Cindy George MRN: [id number] Date of Birth: 07-Jun-1950  Today's Date: 07/21/2011 Time:  -     Visit#: 8  of 18   Re-eval: 07/30/11    Past Medical History:  Past Medical History  Diagnosis Date  . High blood pressure   . Thyroid disorder   . High cholesterol   . Hypothyroidism   . Arthritis    Past Surgical History:  Past Surgical History  Procedure Date  . Thyroid surgery   . Total abdominal hysterectomy   . Colonscopy 2010  . Kidney stone surgery 2011  . Total knee arthroplasty 05/19/2011    Procedure: TOTAL KNEE ARTHROPLASTY;  Surgeon: Fuller Canada, MD;  Location: [facility];  Service: Orthopedics;  Laterality: Right;  computer assisted depuy    Subjective Symptoms/Limitations Symptoms: My knee was hurting last night but it is feelling better today. Pain Assessment Currently in Pain?: No/denies Pain Score: 0-No pain Pain Location: Knee Pain Orientation: Left     Exercise/Treatments    Stretches Quad Stretch: 2 reps;30 seconds Aerobic Stationary Bike: 6' @ 2.0 seat at [employer] for Strengthening   Standing Heel Raises: 10 reps;Limitations Heel Raises Limitations: R LE only Terminal Knee Extension: 15 reps Theraband Level (Terminal Knee Extension): Level 4 (Blue) Lateral Step Up: Right;10 reps;Step Height: 6" Forward Step Up: Right;10 reps;Step Height: 6" Step Down: 10 reps;Step Height: 4";Right Stairs: 2RT Rocker Board: 3 minutes SLS: R 6" max of 3 (R 3" max) Other Standing Knee Exercises: Picking soccer ball off 6" step x 10 Seated Long Arc Quad: Strengthening;Right;20 reps;Weights Long Arc Quad Weight: 5 lbs. Supine Quad Sets: 15 reps Short Arc Quad Sets: 15 reps Heel Slides: 15 reps Straight Leg Raises: 15 reps Sidelying   Prone  Hamstring Curl: 15 reps;Limitations Hamstring Curl Limitations: 3# Hip Extension: 15 reps;Limitations Hip Extension Limitations: 3#      Physical  Therapy Assessment and Plan PT Assessment and Plan Clinical Impression Statement: Pt states she really felt her knee got a good work out today. Rehab Potential: Good PT Plan: begin stool scoot next treatment.    Goals    Problem List Patient Active Problem List  Diagnoses  . ARTHRITIS, RIGHT KNEE  . BUNION, LEFT FOOT  . HAMMER TOE  . CLOSED FRACTURE OF UPPER END OF FIBULA  . HIGH BLOOD PRESSURE  . S/P total knee replacement  . Stiffness of joint, not elsewhere classified, lower leg  . Swelling of joint of right knee  . Difficulty in walking  . Weakness of right leg    PT - End of Session Activity Tolerance: Patient tolerated treatment well General Behavior During Session: [facility] for tasks performed Cognition: [facility] for tasks performed    Rulon Abdalla,CINDY 07/21/2011, 9:01 AM  Physician Documentation Your signature is required to indicate approval of the treatment plan as stated above.  Please sign and either send electronically or make a copy of this report for your files and return this physician signed original.   Please mark one 1.__approve of plan  2. ___approve of plan with the following conditions.   ______________________________                                                          _____________________ Physician Signature  Date  

## 2011-07-22 ENCOUNTER — Telehealth: Payer: Self-pay | Admitting: Orthopedic Surgery

## 2011-07-22 ENCOUNTER — Other Ambulatory Visit: Payer: Self-pay | Admitting: Orthopedic Surgery

## 2011-07-22 DIAGNOSIS — Z96659 Presence of unspecified artificial knee joint: Secondary | ICD-10-CM

## 2011-07-22 MED ORDER — HYDROCODONE-ACETAMINOPHEN 7.5-325 MG PO TABS: 1.0000 | ORAL_TABLET | ORAL | Status: AC | PRN

## 2011-07-22 NOTE — Telephone Encounter (Signed)
Harrell Lark asked for a new Percocet  5/325 prescription.  If you change to something different, she uses [facility] in [place]

## 2011-07-23 ENCOUNTER — Ambulatory Visit (HOSPITAL_COMMUNITY)
Admission: RE | Admit: 2011-07-23 | Discharge: 2011-07-23 | Disposition: A | Discharge status: Home / Self Care | Payer: Managed Care, Other (non HMO) | Source: Ambulatory Visit | Attending: Family Medicine | Admitting: Family Medicine

## 2011-07-23 DIAGNOSIS — M25669 Stiffness of unspecified knee, not elsewhere classified: Secondary | ICD-10-CM

## 2011-07-23 DIAGNOSIS — M25461 Effusion, right knee: Secondary | ICD-10-CM

## 2011-07-23 DIAGNOSIS — R262 Difficulty in walking, not elsewhere classified: Secondary | ICD-10-CM

## 2011-07-23 DIAGNOSIS — R29898 Other symptoms and signs involving the musculoskeletal system: Secondary | ICD-10-CM

## 2011-07-23 NOTE — Progress Notes (Signed)
Physical Therapy Treatment Patient Details  Name: Cindy George MRN: [id number] Date of Birth: 06-26-1950  Today's Date: 07/23/2011 Time: 4098-1191 Time Calculation (min): 40 min Visit#: 9  of 18   Re-eval: 07/30/11  Charge: therex 38 min  Subjective: My knee is a little sore pain scale 1/10  Objective:   Exercise/Treatments Aerobic Stationary Bike: 6' @ 2.0 seat at 5 Standing Heel Raises: Limitations Heel Raises Limitations: R LE only 12 x Terminal Knee Extension: 15 reps Theraband Level (Terminal Knee Extension): Level 4 (Blue) Lateral Step Up: Right;10 reps;Step Height: 6" Forward Step Up: Right;10 reps;Step Height: 6" Step Down: 10 reps;Step Height: 4";Right Functional Squat: 2 sets;10 reps;Limitations Functional Squat Limitations: Picking soccer ball off 6" step x 10 Stairs: 2RT Rocker Board: 3 minutes SLS: R 6" max of 3 Other Standing Knee Exercises: tandem stance 2x 30" Seated Stool Scoot - Round Trips: 2 1/2 RT     Physical Therapy Assessment and Plan PT Assessment and Plan Clinical Impression Statement: Added stool scoot per PT plan without difficulty following instruction for technique. Also added tandem stance to address balance instability noted with SLS, pt able to complete independently with intermitternt HHA required. PT Plan: Continue tandem stance until able to hold 30" with no HHA then progress to tandem gait.  Continue with gait training, ROM, and strength training.    Goals    Problem List Patient Active Problem List  Diagnoses  . ARTHRITIS, RIGHT KNEE  . BUNION, LEFT FOOT  . HAMMER TOE  . CLOSED FRACTURE OF UPPER END OF FIBULA  . HIGH BLOOD PRESSURE  . S/P total knee replacement  . Stiffness of joint, not elsewhere classified, lower leg  . Swelling of joint of right knee  . Difficulty in walking  . Weakness of right leg    PT - End of Session Activity Tolerance: Patient tolerated treatment well General Behavior During Session:  [facility] for tasks performed Cognition: [facility] for tasks performed  GP No functional reporting required  Juel Burrow 07/23/2011, 1:04 PM

## 2011-07-25 ENCOUNTER — Ambulatory Visit (HOSPITAL_COMMUNITY)
Admission: RE | Admit: 2011-07-25 | Discharge: 2011-07-25 | Disposition: A | Discharge status: Home / Self Care | Payer: Managed Care, Other (non HMO) | Source: Ambulatory Visit

## 2011-07-25 DIAGNOSIS — R262 Difficulty in walking, not elsewhere classified: Secondary | ICD-10-CM

## 2011-07-25 DIAGNOSIS — M25461 Effusion, right knee: Secondary | ICD-10-CM

## 2011-07-25 DIAGNOSIS — M25669 Stiffness of unspecified knee, not elsewhere classified: Secondary | ICD-10-CM

## 2011-07-25 DIAGNOSIS — R29898 Other symptoms and signs involving the musculoskeletal system: Secondary | ICD-10-CM

## 2011-07-25 NOTE — Progress Notes (Signed)
Physical Therapy Treatment Patient Details  Name: Cindy George MRN: [id number] Date of Birth: 1950-05-17  Today's Date: 07/25/2011 Time: 4098-1191 Time Calculation (min): 38 min Visit#: 10  of 18   Re-eval: 07/30/11  Charge: therex 32 min  Subjective: Symptoms/Limitations Symptoms: Spent 4-5 hours working in yard yesterday, very sore today wouldn't say any real pain today. Pain Assessment Currently in Pain?: No/denies  Objective:   Exercise/Treatments Aerobic Stationary Bike: 6' @ 2.0 seat at 5 Machines for Strengthening Cybex Knee Extension: 1 Pl 10x Cybex Leg Press: 2 PL 10 x Standing Heel Raises: 2 sets;15 reps Heel Raises Limitations: B LE x 15, R LE only 15 Terminal Knee Extension: 15 reps Theraband Level (Terminal Knee Extension): Level 4 (Blue) Lateral Step Up: Right;10 reps;Step Height: 6" Forward Step Up: Right;10 reps;Step Height: 6" Step Down: 10 reps;Step Height: 4";Right Functional Squat: 2 sets;10 reps;Limitations Functional Squat Limitations: Picking soccer ball off 6" step  SLS: R 6" max of 3; 3x 30" with 1 finger assistance Other Standing Knee Exercises: tandem stance 3x 30" intermittent HHA Seated Stool Scoot - Round Trips: 2 RT    Physical Therapy Assessment and Plan PT Assessment and Plan Clinical Impression Statement: Pt tolerated well towards total treatment, progressed to cybex machines for strengthening without difficulty.  Pt improving balance each set with min cueing for spatial awareness, able to tandem stance independently with intermitternt HHA. PT Plan: Continue with current POC, progress gait mechanics, balance, ROM and strength.  Reassess next week.    Goals    Problem List Patient Active Problem List  Diagnoses  . ARTHRITIS, RIGHT KNEE  . BUNION, LEFT FOOT  . HAMMER TOE  . CLOSED FRACTURE OF UPPER END OF FIBULA  . HIGH BLOOD PRESSURE  . S/P total knee replacement  . Stiffness of joint, not elsewhere classified, lower leg   . Swelling of joint of right knee  . Difficulty in walking  . Weakness of right leg    PT - End of Session Activity Tolerance: Patient tolerated treatment well General Behavior During Session: [facility] for tasks performed Cognition: [facility] for tasks performed  GP No functional reporting required  Juel Burrow, PTA 07/25/2011, 11:21 AM

## 2011-07-28 ENCOUNTER — Ambulatory Visit (HOSPITAL_COMMUNITY)
Admission: RE | Admit: 2011-07-28 | Discharge: 2011-07-28 | Disposition: A | Discharge status: Home / Self Care | Payer: Managed Care, Other (non HMO) | Source: Ambulatory Visit | Attending: Family Medicine | Admitting: Family Medicine

## 2011-07-28 NOTE — Telephone Encounter (Signed)
Per The nurse, Marijean Niemann, this had been done

## 2011-07-28 NOTE — Progress Notes (Signed)
Physical Therapy Treatment Patient Details  Name: Cindy George MRN: [id number] Date of Birth: 02/10/1951  Today's Date: 07/28/2011 Time: 8469-6295 Time Calculation (min): 39 min Visit#: 11  of 18   Re-eval: 07/30/11 Diagnosis: R TKA Next MD Visit: 08/04/11 Charges:  32'  Subjective: Symptoms/Limitations Symptoms: Pt. states she doesn't hurt today; reports she raked alot over the weekend and got it a little sore but none today. Pain Assessment Currently in Pain?: No/denies  Exercises Instructed by Trilby Leaver, SPTA under the direction of [name], PTA/CI. Exercise/Treatments Aerobic Stationary Bike: 6' @ 2.0 seat at 5 Machines for Strengthening Cybex Knee Extension: 1 Pl  2 sets 10 reps Cybex Knee Flexion: 3 PL 2 sets 10 reps Cybex Leg Press: 3 PL 2 sets 10 reps Standing Heel Raises: 2 sets;15 reps Heel Raises Limitations: B LE x 15, R LE only 15 Terminal Knee Extension: 15 reps Theraband Level (Terminal Knee Extension): Level 4 (Blue) Lateral Step Up: Right;10 reps;Step Height: 6" Forward Step Up: Right;10 reps;Step Height: 6" Step Down: 10 reps;Step Height: 4";Right Functional Squat: 2 sets;10 reps;Limitations Functional Squat Limitations: Picking soccer ball off 6" step (did not do today)  SLS: R max of 4"X 3 trials without UE"S Other Standing Knee Exercises: tandem stance 3x 30" intermittent HHA Seated Stool Scoots 2RT   Physical Therapy Assessment and Plan PT Assessment and Plan Clinical Impression Statement: Progressed to 2 sets of 10 reps with cybex machines without difficulty.  Pt. with improving spatial awareness, began SLS without UE assistance today with max balance of 4 seconds. PT Plan: Reassess next visit.      PT - End of Session Activity Tolerance: Patient tolerated treatment well General Behavior During Session: [facility] for tasks performed Cognition: [facility] for tasks performed   Trilby Leaver, SPTA/ [name] B. Bascom Levels, PTA 07/28/2011,  10:15 AM

## 2011-07-30 ENCOUNTER — Ambulatory Visit (HOSPITAL_COMMUNITY)
Admission: RE | Admit: 2011-07-30 | Discharge: 2011-07-30 | Disposition: A | Discharge status: Home / Self Care | Payer: Managed Care, Other (non HMO) | Source: Ambulatory Visit | Attending: Family Medicine | Admitting: Family Medicine

## 2011-07-30 NOTE — Evaluation (Signed)
Physical Therapy re-Evaluation  Patient Details  Name: Cindy George MRN: [id number] Date of Birth: 1950-09-12  Today's Date: 07/30/2011 Time: 0802-0851 Time Calculation (min): 49 min  Visit#: 12  of 24   Re-eval: 08/29/11 Assessment Diagnosis: R TKA Surgical Date: 05/19/11 Next MD Visit: 08/04/2011  Past Medical History:  Past Medical History  Diagnosis Date  . High blood pressure   . Thyroid disorder   . High cholesterol   . Hypothyroidism   . Arthritis    Past Surgical History:  Past Surgical History  Procedure Date  . Thyroid surgery   . Total abdominal hysterectomy   . Colonscopy 2010  . Kidney stone surgery 2011  . Total knee arthroplasty 05/19/2011    Procedure: TOTAL KNEE ARTHROPLASTY;  Surgeon: Fuller Canada, MD;  Location: [facility];  Service: Orthopedics;  Laterality: Right;  computer assisted depuy    Subjective Symptoms/Limitations Symptoms: Steps are the hardest thing for me to do now.  I was on my feet from 7:00am to 4:00 pm and I did alright. How long can you sit comfortably?: Pt is able to sit for an hour and a half now where she was able to sit for a half hour to forty minutes. How long can you stand comfortably?: No problems standing now How long can you walk comfortably?: No problem walking  Special Tests: Pt is no longer waking up during the night due to pain. Pain Assessment Currently in Pain?: No/denies Pain Score: 0-No pain Pain Location: Knee Pain Orientation: Left  Assessment RLE AROM (degrees) Right Knee Extension 0-130: 5 was 5 Right Knee Flexion 0-140: 115  Was 110 RLE PROM (degrees) Right Knee Extension 0-130: 0  RLE Strength Right Hip Flexion: 5/5 Right Hip Extension:  (4/5 was 3+, L is at 3+) Right Hip ABduction: 3/5 (was 3/5 ; L is 3/5) Right Hip ADduction: 5/5 (was 3+/5) Right Knee Flexion:  (4+ was 4/5) Right Knee Extension: 5/5 Right Ankle Dorsiflexion: 5/5 (was 4/5)  Exercise/Treatments Mobility/Balance       Stretches   Aerobic Stationary Bike: 6' 2.0 Standing Other Standing Knee Exercises: stepping stairs 4" 6" 8"   Supine Quad Sets: 15 reps Terminal Knee Extension: 15 reps Sidelying Hip ABduction: 15 reps Prone  Hamstring Curl: 10 reps;Limitations Hamstring Curl Limitations: PRE 3,8,12 #     Physical Therapy Assessment and Plan PT Assessment and Plan: Continue three times a week for 4 more weeks to work on the remaining deficits. Clinical Impression Statement: Pt has improved with strength.  Focus at this time needs to be hamstring strengthening, Hip abductor and hip extension strengthening as well as ROM.  Focus on ext more than flex, hip ab f/b ham f/b ext. Rehab Potential: Good Clinical Impairments Affecting Rehab Potential: Pt continues to have a trendelenburg gait. B hip ab are 3/5;  Pt stength has iimproved in all mm except hip abductor.  Pt to work on ROM and above weakened mm. PT Plan: side step with t-band, sit to stand, stand knee flex, steps, terminal ext both standing and supine; PROM, chair pulls and standing lunge with leg on 8" step.   Squat down to pick object off floor and stand back up....  Goals Home Exercise Program Pt will Perform Home Exercise Program: Independently PT Goal: Perform Home Exercise Program - Progress: Met PT Short Term Goals PT Short Term Goal 1 - Progress: Progressing toward goal PT Short Term Goal 2: ROM is at 5 to 115 PT Short Term Goal 2 -  Progress: Met PT Short Term Goal 3 - Progress: Met PT Long Term Goals PT Long Term Goal 1 - Progress: Not met PT Long Term Goal 2 - Progress: Progressing toward goal Long Term Goal 3: Hip mm for ext and abduction is still weak. Long Term Goal 4 Progress: Met Long Term Goal 5 Progress: Met  Problem List Patient Active Problem List  Diagnoses  . ARTHRITIS, RIGHT KNEE  . BUNION, LEFT FOOT  . HAMMER TOE  . CLOSED FRACTURE OF UPPER END OF FIBULA  . HIGH BLOOD PRESSURE  . S/P total knee  replacement  . Stiffness of joint, not elsewhere classified, lower leg  . Swelling of joint of right knee  . Difficulty in walking  . Weakness of right leg    PT - End of Session Activity Tolerance: Patient tolerated treatment well General Behavior During Session: [facility] for tasks performed Cognition: [facility] for tasks performed    Jaxson Keener,CINDY 07/30/2011, 9:57 AM  Physician Documentation Your signature is required to indicate approval of the treatment plan as stated above.  Please sign and either send electronically or make a copy of this report for your files and return this physician signed original.   Please mark one 1.__approve of plan  2. ___approve of plan with the following conditions.   ______________________________                                                          _____________________ Physician Signature                                                                                                             Date

## 2011-08-01 ENCOUNTER — Ambulatory Visit (HOSPITAL_COMMUNITY)
Admission: RE | Admit: 2011-08-01 | Discharge: 2011-08-01 | Disposition: A | Discharge status: Home / Self Care | Payer: Managed Care, Other (non HMO) | Source: Ambulatory Visit | Attending: Family Medicine | Admitting: Family Medicine

## 2011-08-01 NOTE — Progress Notes (Signed)
Physical Therapy Treatment Patient Details  Name: RANAE CASEBIER MRN: [id number] Date of Birth: [birth year]  Today's Date: 08/01/2011 Time: 0810-0913 Time Calculation (min): 63 min Visit#: 13  of 24   Re-eval: 08/29/11    Subjective: Symptoms/Limitations Symptoms: Pt states she is doing well  Precautions/Restrictions     Exercise/Treatments   Aerobic Elliptical: 4' @ 2.0 Standing Knee Flexion: Strengthening;Left;15 reps;Limitations Knee Flexion Limitations: 5# Other Standing Knee Exercises: side step with t-band Other Standing Knee Exercises: lunge onto 8: step x 30sec. Seated Other Seated Knee Exercises: sit to stand 10 Supine Quad Sets: Right;15 reps Terminal Knee Extension: Right;10 reps   Modalities Modalities: Cryotherapy Cryotherapy Number Minutes Cryotherapy: 10 Minutes Cryotherapy Location: Knee Type of Cryotherapy: Ice pack  Physical Therapy Assessment and Plan PT Assessment and Plan Clinical Impression Statement: Pt shows visible fatigue with new exercise regieme. Rehab Potential: Good PT Plan: Begin PRE with side-lying abduction and clams    Goals    Problem List Patient Active Problem List  Diagnoses  . ARTHRITIS, RIGHT KNEE  . BUNION, LEFT FOOT  . HAMMER TOE  . CLOSED FRACTURE OF UPPER END OF FIBULA  . HIGH BLOOD PRESSURE  . S/P total knee replacement  . Stiffness of joint, not elsewhere classified, lower leg  . Swelling of joint of right knee  . Difficulty in walking  . Weakness of right leg    PT - End of Session Activity Tolerance: Patient tolerated treatment well General Behavior During Session: [facility] for tasks performed Cognition: [facility] for tasks performed  GP No functional reporting required  [place],CINDY 08/01/2011, 9:07 AM

## 2011-08-05 ENCOUNTER — Ambulatory Visit (HOSPITAL_COMMUNITY): Payer: Managed Care, Other (non HMO) | Admitting: Physical Therapy

## 2011-08-06 ENCOUNTER — Ambulatory Visit (HOSPITAL_COMMUNITY)
Admission: RE | Admit: 2011-08-06 | Discharge: 2011-08-06 | Disposition: A | Discharge status: Home / Self Care | Payer: Managed Care, Other (non HMO) | Source: Ambulatory Visit | Attending: Family Medicine | Admitting: Family Medicine

## 2011-08-06 DIAGNOSIS — M6281 Muscle weakness (generalized): Secondary | ICD-10-CM | POA: Insufficient documentation

## 2011-08-06 DIAGNOSIS — R262 Difficulty in walking, not elsewhere classified: Secondary | ICD-10-CM | POA: Insufficient documentation

## 2011-08-06 DIAGNOSIS — M25569 Pain in unspecified knee: Secondary | ICD-10-CM | POA: Insufficient documentation

## 2011-08-06 DIAGNOSIS — M25669 Stiffness of unspecified knee, not elsewhere classified: Secondary | ICD-10-CM | POA: Insufficient documentation

## 2011-08-06 DIAGNOSIS — IMO0001 Reserved for inherently not codable concepts without codable children: Secondary | ICD-10-CM | POA: Insufficient documentation

## 2011-08-06 NOTE — Progress Notes (Signed)
Physical Therapy Treatment Patient Details  Name: YISELL SPRUNGER MRN: [id number] Date of Birth: 12/18/1950  Today's Date: 08/06/2011 Time: 4098-1191 PT Time Calculation (min): 41 min Visit#: 14  of 24   Re-eval: 08/29/11 Charges: Therex x 38'  Subjective: Symptoms/Limitations Symptoms: Pt states that she would like to get an elliptical for home use. Pain Assessment Currently in Pain?: No/denies Pain Score: 0-No pain   Exercise/Treatments Aerobic Elliptical: 4' @ 2.0 to improve mm endurance and strength Machines for Strengthening Cybex Knee Extension: 1 Pl  2x10 with RLE eccentric lowering Cybex Knee Flexion: 3 PL 2x10 RLE only Standing Other Standing Knee Exercises: side step with green t-band around knees 2RT Other Standing Knee Exercises: lunge onto 8" step 3x30" Seated Other Seated Knee Exercises: sit to stand 10 Supine Quad Sets: Right;15 reps Sidelying Hip ABduction: 10 reps;Limitations Hip ABduction Limitations: PRE 3# 5# Clams: 5x10"  Physical Therapy Assessment and Plan PT Assessment and Plan Clinical Impression Statement: Pt unable to complete side stepping with blue tband around ankles. Exercises was modified to green tband around knees. Pt Tolerates increased time on elliptical well. Began clams and PRE for abduction with multimodal cueing for proper form. Pt reports 0/10 pain at end of session. PT Plan: Continue to progress per PT POC.     Problem List Patient Active Problem List  Diagnoses  . ARTHRITIS, RIGHT KNEE  . BUNION, LEFT FOOT  . HAMMER TOE  . CLOSED FRACTURE OF UPPER END OF FIBULA  . HIGH BLOOD PRESSURE  . S/P total knee replacement  . Stiffness of joint, not elsewhere classified, lower leg  . Swelling of joint of right knee  . Difficulty in walking  . Weakness of right leg    PT - End of Session Activity Tolerance: Patient tolerated treatment well General Behavior During Session: [facility] for tasks performed Cognition: [facility] for tasks  performed   Seth Bake, PTA 08/06/2011, 3:47 PM

## 2011-08-07 ENCOUNTER — Ambulatory Visit (HOSPITAL_COMMUNITY)
Admission: RE | Admit: 2011-08-07 | Discharge: 2011-08-07 | Disposition: A | Discharge status: Home / Self Care | Payer: Managed Care, Other (non HMO) | Source: Ambulatory Visit | Attending: Orthopedic Surgery | Admitting: Orthopedic Surgery

## 2011-08-07 NOTE — Progress Notes (Signed)
Physical Therapy Treatment Patient Details  Name: Cindy George MRN: [id number] Date of Birth: 1951/03/08  Today's Date: 08/07/2011 Time: 1015-1058 PT Time Calculation (min): 43 min Visit#: 15  of 24   Re-eval: 08/29/11 Charges: Therex x 40'   Subjective: Symptoms/Limitations Symptoms: Pt is pain free and without complaint. Pain Assessment Currently in Pain?: No/denies Pain Score: 0-No pain   Exercise/Treatments Aerobic Elliptical: 5' @ 3.0 to improve mm endurance and strength Machines for Strengthening Cybex Knee Extension: 1 Pl  2x10 with RLE eccentric lowering Cybex Knee Flexion: 3 PL 2x10 RLE only Standing Other Standing Knee Exercises: side step with green t-band around knees 2RT Other Standing Knee Exercises: lunge onto 8" step 3x30" Supine Short Arc Quad Sets: 15 reps;Limitations Short Arc Quad Sets Limitations: 5# 5" holds Bridges: 10 reps;Limitations Bridges Limitations: 5" holds Sidelying Hip ABduction: 10 reps;Limitations Hip ABduction Limitations: PRE 3# 5# Clams: 5x10"  Physical Therapy Assessment and Plan PT Assessment and Plan Clinical Impression Statement: Pt completes exercises with increased ease this session. Increased resistance on elliptical without difficulty. Pt presents with decreased antalgia. Pt reports 0/10 pain at end of session. PT Plan: Continue to progress per PT POC.     Problem List Patient Active Problem List  Diagnoses  . ARTHRITIS, RIGHT KNEE  . BUNION, LEFT FOOT  . HAMMER TOE  . CLOSED FRACTURE OF UPPER END OF FIBULA  . HIGH BLOOD PRESSURE  . S/P total knee replacement  . Stiffness of joint, not elsewhere classified, lower leg  . Swelling of joint of right knee  . Difficulty in walking  . Weakness of right leg    PT - End of Session Activity Tolerance: Patient tolerated treatment well General Behavior During Session: [facility] for tasks performed Cognition: [facility] for tasks performed    Seth Bake, PTA 08/07/2011,  11:02 AM

## 2011-08-11 ENCOUNTER — Ambulatory Visit (HOSPITAL_COMMUNITY)
Admission: RE | Admit: 2011-08-11 | Discharge: 2011-08-11 | Disposition: A | Discharge status: Home / Self Care | Payer: Managed Care, Other (non HMO) | Source: Ambulatory Visit | Attending: Family Medicine | Admitting: Family Medicine

## 2011-08-11 NOTE — Progress Notes (Signed)
Physical Therapy Treatment Patient Details  Name: Cindy George MRN: [id number] Date of Birth: 1951/01/03  Today's Date: 08/11/2011 Time: 4098-1191 PT Time Calculation (min): 35 min Visit#: 16  of 24   Re-eval: 08/29/11 Charges: MMT x 1 ROMM x 1 Therex x 10'  Subjective: Symptoms/Limitations Symptoms: Pt reports that she is not having any pain but she does complain of tightness Pain Assessment Currently in Pain?: No/denies Pain Score: 0-No pain  Objective: R knee AROM 3-114; PROM 0-117; RLE strength WNL excepts hip abd 4/5  Exercise/Treatments Machines for Strengthening Cybex Knee Extension: 1.5 Pl  2x10 with RLE eccentric lowering Cybex Knee Flexion: 3.5 PL 2x10 RLE only Standing Functional Squat: 15 reps Other Standing Knee Exercises: lunge onto 8" step 3x30" Supine Heel Slides: 10 reps  Physical Therapy Assessment and Plan PT Assessment and Plan Clinical Impression Statement: Pt Continue to progress well with strength and ROM. R LE strength is WNL except for abd which is 4/5. Written progress note sent to MD as computers were down during pt's visit.  PT Plan: Continue to progress per PT POC.     Problem List Patient Active Problem List  Diagnoses  . ARTHRITIS, RIGHT KNEE  . BUNION, LEFT FOOT  . HAMMER TOE  . CLOSED FRACTURE OF UPPER END OF FIBULA  . HIGH BLOOD PRESSURE  . S/P total knee replacement  . Stiffness of joint, not elsewhere classified, lower leg  . Swelling of joint of right knee  . Difficulty in walking  . Weakness of right leg    PT - End of Session Activity Tolerance: Patient tolerated treatment well General Behavior During Session: [facility] for tasks performed Cognition: [facility] for tasks performed    Seth Bake, PTA 08/11/2011, 12:49 PM

## 2011-08-12 ENCOUNTER — Ambulatory Visit (INDEPENDENT_AMBULATORY_CARE_PROVIDER_SITE_OTHER): Payer: Managed Care, Other (non HMO) | Admitting: Orthopedic Surgery

## 2011-08-12 ENCOUNTER — Encounter: Payer: Self-pay | Admitting: Orthopedic Surgery

## 2011-08-12 VITALS — BP 132/80 | Ht <= 58 in | Wt 154.0 lb

## 2011-08-12 DIAGNOSIS — Z96659 Presence of unspecified artificial knee joint: Secondary | ICD-10-CM

## 2011-08-12 DIAGNOSIS — M1710 Unilateral primary osteoarthritis, unspecified knee: Secondary | ICD-10-CM

## 2011-08-12 NOTE — Patient Instructions (Signed)
activity as tolerated

## 2011-08-12 NOTE — Progress Notes (Signed)
Patient ID: Cindy George, female   DOB: 02-Dec-1950, 61 y.o.   MRN: [id number] Chief Complaint  Patient presents with  . Follow-up    6 weel recheck right knee, DOS 05/19/11    Has no patient had a knee replacement in February. She is doing well.  Review of systems she has no real complaints other than some pain after therapy. She went to stop therapy at this time. I am in agreement with that.  She is ambulating no assistive devices. She has normal appearance of her knee. Her flexion is return and is now 115. The knee is stable. The strength is normal. His skin incision is intact. Vascularity is normal as well.  Status post total knee 3 months doing well without complications or problems.  Followup in February of 2014 4 annual postop film

## 2011-08-13 ENCOUNTER — Ambulatory Visit (HOSPITAL_COMMUNITY)
Admission: RE | Admit: 2011-08-13 | Discharge: 2011-08-13 | Disposition: A | Discharge status: Home / Self Care | Payer: Managed Care, Other (non HMO) | Source: Ambulatory Visit | Attending: Family Medicine | Admitting: Family Medicine

## 2011-08-13 NOTE — Progress Notes (Signed)
Physical Therapy Treatment Patient Details  Name: Cindy George MRN: [id number] Date of Birth: 1950/12/26  Today's Date: 08/13/2011 Time: 4098-1191 PT Time Calculation (min): 36 min Visit#: 17  of 24   Re-eval: 08/29/11 Charges: ROMM x 1 Self care x 25'    Subjective: Symptoms/Limitations Symptoms: Pt states that she worked in her yard yesterday and is very sore today.  Pain Assessment Currently in Pain?: Yes Pain Score:   2 Pain Location: Knee Pain Orientation: Left  Objective:  08/13/11 0907  RLE AROM (degrees)  Right Knee Extension 0-130 0   Right Knee Flexion 0-140 115   RLE PROM (degrees)  Right Knee Extension 0-130 0   Right Knee Flexion 0-140 120   RLE Strength  Right Hip Flexion 5/5  Right Hip Extension 5/5  Right Hip ABduction 4/5  Right Hip ADduction 5/5  Right Knee Flexion 5/5  Right Knee Extension 5/5  Right Ankle Dorsiflexion 5/5    Exercise/Treatments Aerobic Elliptical: 5' @ 3.0 to improve mm endurance and strength   Physical Therapy Assessment and Plan PT Assessment and Plan Clinical Impression Statement: Pt states that she is ready to D/C from therapy. Pt reprots taht she plans to begin working out at [employer]. MD states that he is okay with D/C from therapy per progress note. HEP issued and reviewed.  PT Plan: Recommend to D/C to HEP.    Goals Home Exercise Program Pt will Perform Home Exercise Program: Independently PT Short Term Goals Time to Complete Short Term Goals: 2 weeks PT Short Term Goal 1: ROM to improve to 0 -115 to allow normalized heel toe gait pattern PT Short Term Goal 1 - Progress: Met PT Short Term Goal 2: Pt to be able to sit for an hour without feeling as if she needed to get up to be able to eat out. PT Short Term Goal 2 - Progress: Met PT Short Term Goal 3: Pain level to be no greater than a 3 PT Short Term Goal 3 - Progress: Met PT Long Term Goals Time to Complete Long Term Goals: 4 weeks PT Long Term Goal 1:  ROM to 120 to allow pt to squat to pick items off the floor. PT Long Term Goal 1 - Progress: Partly met (Pt able to achieve 120 degrees of flexion PROM; 115 AROM) PT Long Term Goal 2: Pt strength to be improved by one grade to allow a normalized gait. PT Long Term Goal 2 - Progress: Met Long Term Goal 3: Pt pain no greater than a 1 to allow pt to sleep waking only 2 times a night which is normal for patient. Long Term Goal 3 Progress: Partly met (Pt has not had difficulty with this until last night) Long Term Goal 4: Pt to be able to sit for two hours without getting up to allow pt to travel to her daughter's house in [place]. Long Term Goal 4 Progress: Met PT Long Term Goal 5: Pt to be able to stand for over an hour to be able to make a large meal. Long Term Goal 5 Progress: Met Additional PT Long Term Goals?: Yes PT Long Term Goal 6: Pt to be able to be on her feet for over two hours with walking/ standing and stooping to be able to complete housework in one setting. Long Term Goal 6 Progress: Met  Problem List Patient Active Problem List  Diagnoses  . ARTHRITIS, RIGHT KNEE  . BUNION, LEFT FOOT  . HAMMER TOE  .  CLOSED FRACTURE OF UPPER END OF FIBULA  . HIGH BLOOD PRESSURE  . S/P total knee replacement  . Stiffness of joint, not elsewhere classified, lower leg  . Swelling of joint of right knee  . Difficulty in walking  . Weakness of right leg    PT - End of Session Activity Tolerance: Patient tolerated treatment well General Behavior During Session: [facility] for tasks performed Cognition: [facility] for tasks performed    Seth Bake, PTA 08/13/2011, 9:30 AM

## 2011-08-15 ENCOUNTER — Ambulatory Visit (HOSPITAL_COMMUNITY): Payer: Managed Care, Other (non HMO) | Admitting: Physical Therapy

## 2011-08-18 ENCOUNTER — Ambulatory Visit (HOSPITAL_COMMUNITY): Payer: Managed Care, Other (non HMO) | Admitting: Physical Therapy

## 2011-08-20 ENCOUNTER — Ambulatory Visit (HOSPITAL_COMMUNITY): Payer: Managed Care, Other (non HMO) | Admitting: *Deleted

## 2011-08-22 ENCOUNTER — Ambulatory Visit (HOSPITAL_COMMUNITY): Payer: Managed Care, Other (non HMO) | Admitting: Physical Therapy

## 2011-08-25 ENCOUNTER — Ambulatory Visit (HOSPITAL_COMMUNITY): Payer: Managed Care, Other (non HMO) | Admitting: Physical Therapy

## 2011-08-27 ENCOUNTER — Ambulatory Visit (HOSPITAL_COMMUNITY): Payer: Managed Care, Other (non HMO) | Admitting: *Deleted

## 2011-08-29 ENCOUNTER — Ambulatory Visit (HOSPITAL_COMMUNITY): Payer: Managed Care, Other (non HMO) | Admitting: Physical Therapy

## 2011-09-02 ENCOUNTER — Ambulatory Visit (HOSPITAL_COMMUNITY): Payer: Managed Care, Other (non HMO) | Admitting: Physical Therapy

## 2011-09-03 ENCOUNTER — Ambulatory Visit (HOSPITAL_COMMUNITY): Payer: Managed Care, Other (non HMO) | Admitting: Physical Therapy

## 2011-09-05 ENCOUNTER — Ambulatory Visit (HOSPITAL_COMMUNITY): Payer: Managed Care, Other (non HMO) | Admitting: Physical Therapy

## 2011-09-12 IMAGING — CT CT ABD-PELV W/O CM
2 of 4 series · 16 of 46 positions shown, 18 images · non-contrast
Comparison: None

CLINICAL DATA: Hematuria, bilateral flank pain, UTI, past history
hypertension, hysterectomy, appendectomy

CT ABDOMEN AND PELVIS WITHOUT CONTRAST
TECHNIQUE: Multidetector CT imaging of the abdomen and pelvis was
performed following the standard protocol without intravenous
contrast. Breast shield utilized.  Sagittal and coronal MPR images
reconstructed from axial data set.

[Series 2: standard/full over (age)lbs 5.0 · axial · 0.77mm/px · z∈[-468,-88]mm · 13 of 84 slices shown, 15 images]
[im 4/84  soft-tissue]
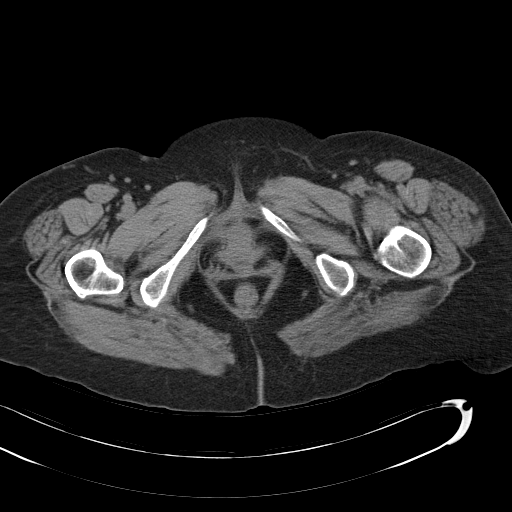
[im 4/84  bone]
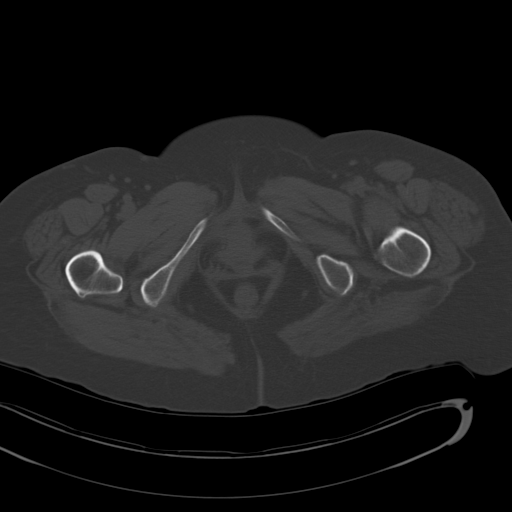
[im 10/84  soft-tissue]
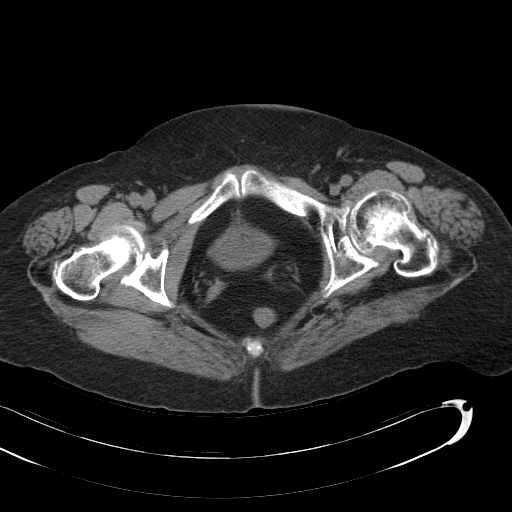
[im 17/84  soft-tissue]
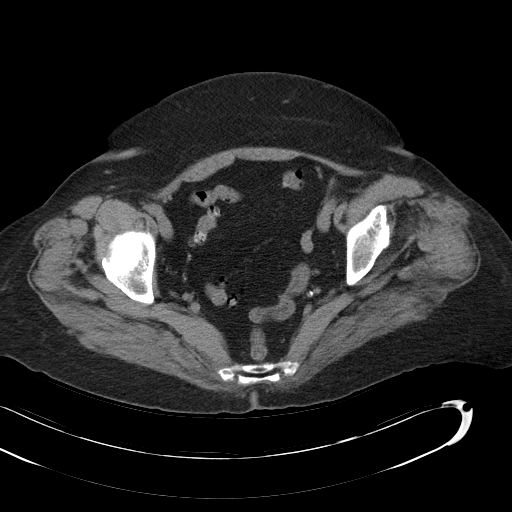
[im 24/84  soft-tissue]
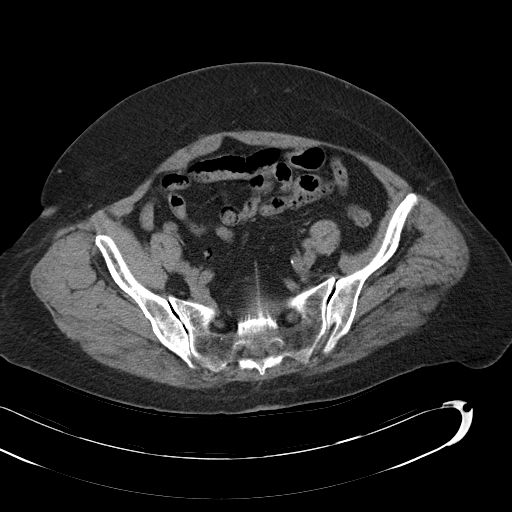
[im 30/84  soft-tissue]
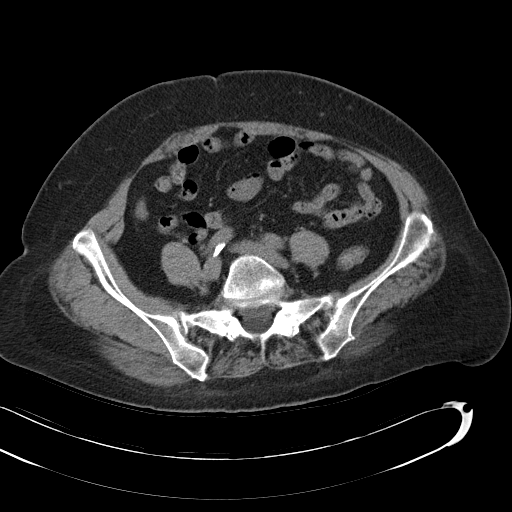
[im 37/84  soft-tissue]
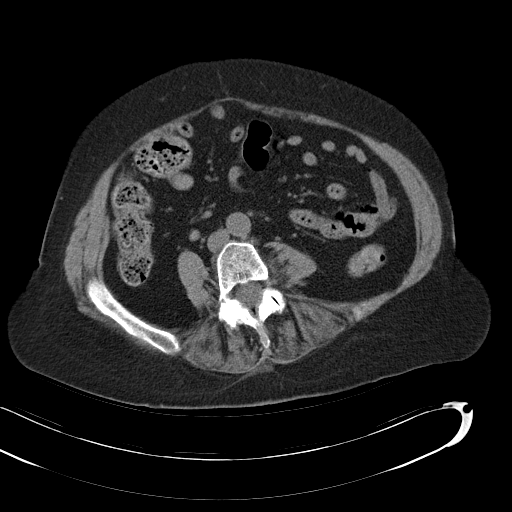
[im 44/84  soft-tissue]
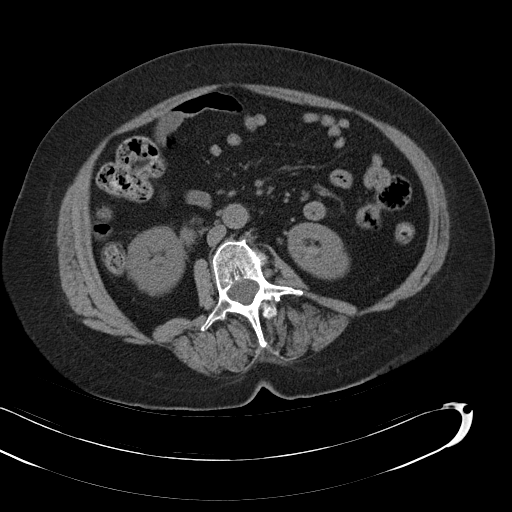
[im 47/84  soft-tissue]
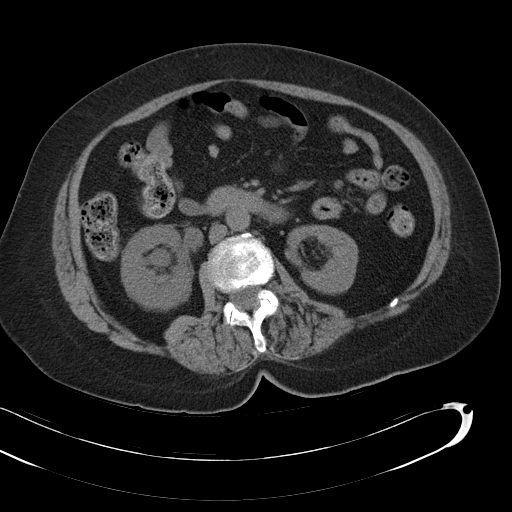
[im 54/84  soft-tissue]
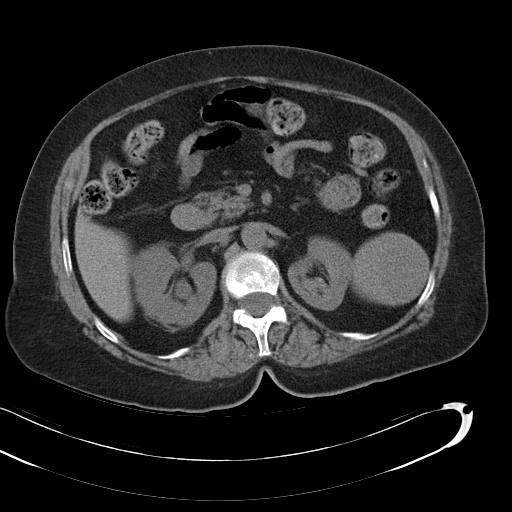
[im 54/84  bone]
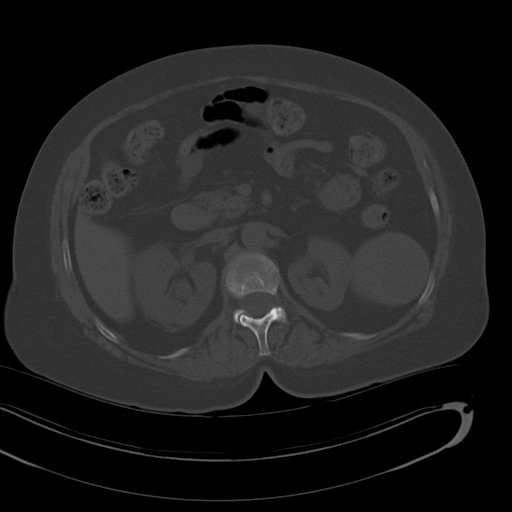
[im 60/84  soft-tissue]
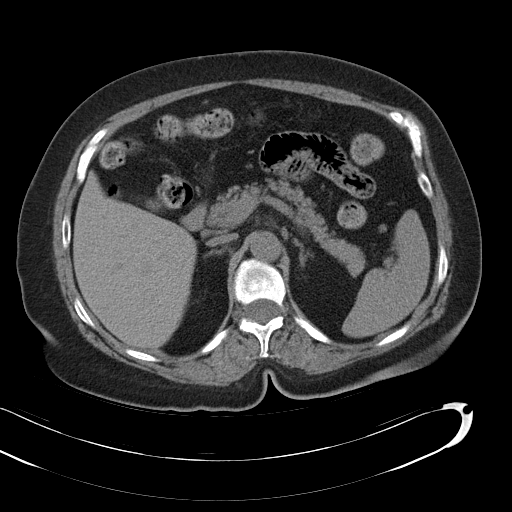
[im 67/84  soft-tissue]
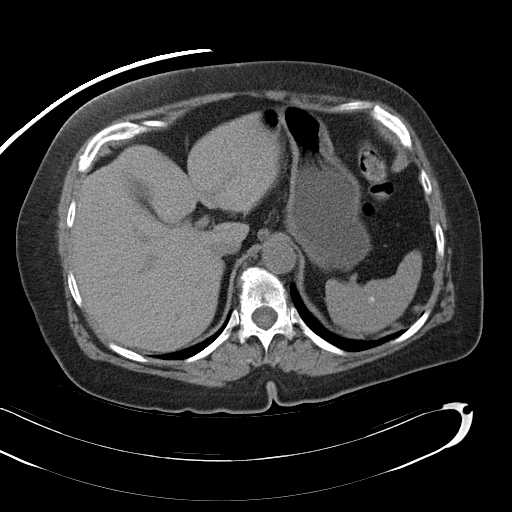
[im 74/84  soft-tissue]
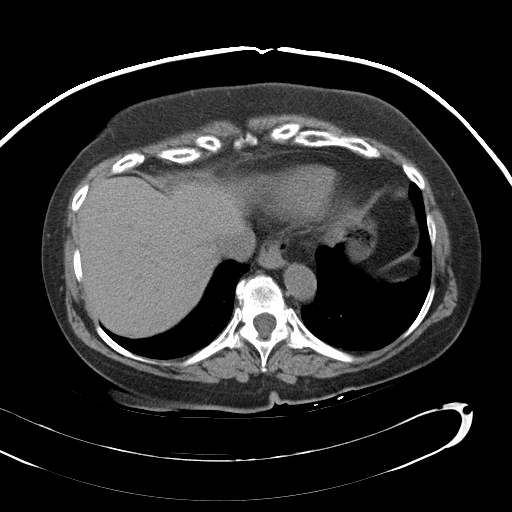
[im 80/84  soft-tissue]
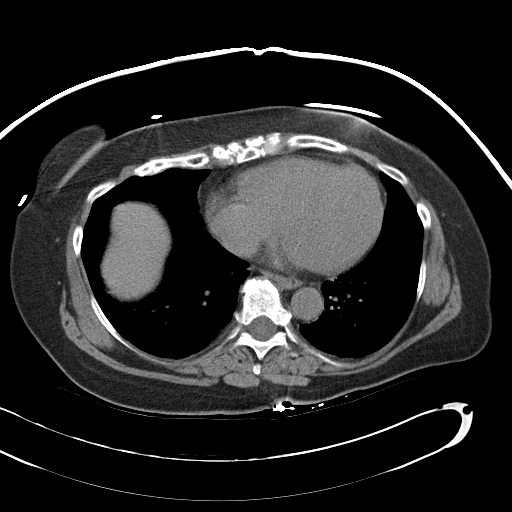

[Series 4: mpr coronal · coronal · 0.70mm/px · 3 of 85 slices shown]
[im 29/85  soft-tissue]
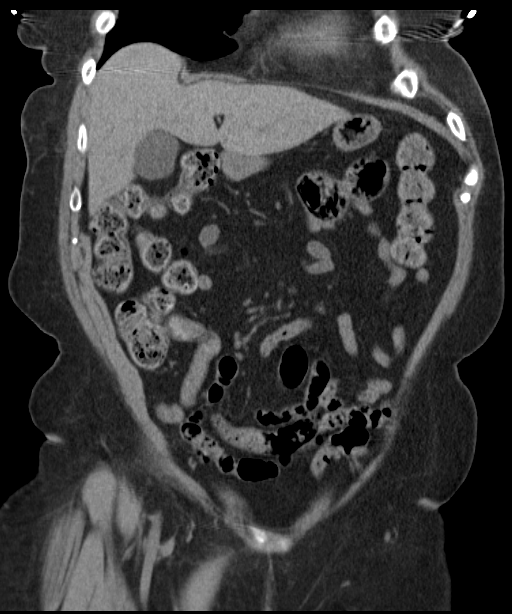
[im 38/85  soft-tissue]
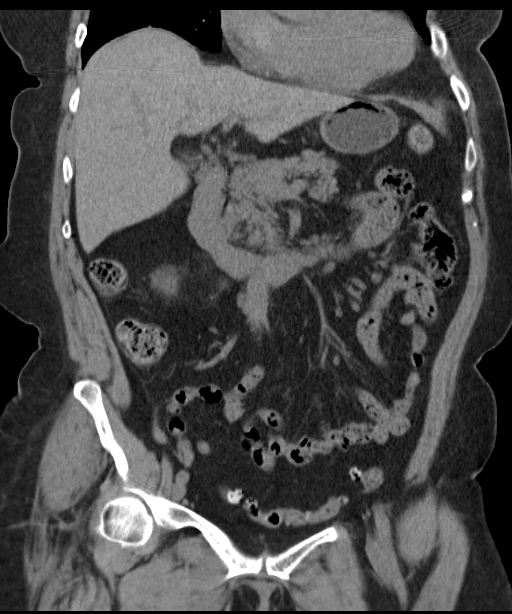
[im 47/85  soft-tissue]
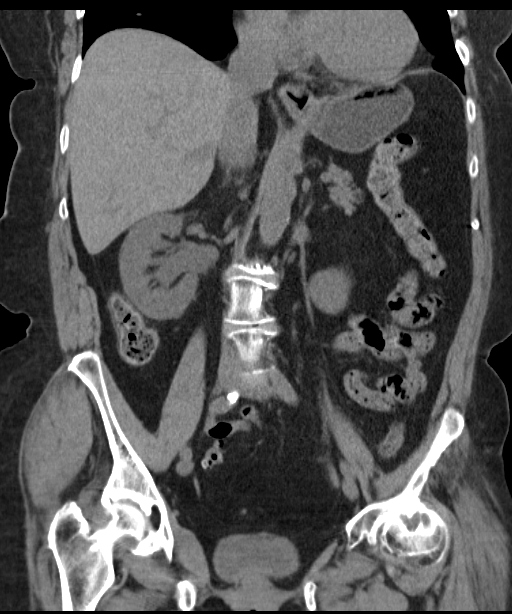

[16 of 46 positions shown; findings below may reference images not displayed]

FINDINGS: Mild bibasilar atelectasis.
Right hydronephrosis or renal enlargement secondary to a 5 mm
diameter 6 mm length right ureteropelvic junction calculus.
Mild right perinephric edema.
Additional calcification immediately inferior to the left renal
calculus appears to be a phlebolith within the right gonadal vein.
No additional urinary tract calcifications, ureteral dilatation, or
bladder abnormality.
Within limits of a nonenhanced exam, no focal abnormalities of
liver, spleen, pancreas, or adrenal glands.
Diverticulosis of the distal descending and sigmoid colon without
evidence of diverticulitis.
Stomach and bowel loops otherwise unremarkable.
Minimal atherosclerotic calcification.
No mass, adenopathy, free fluid or inflammatory process.
Advanced osteoarthritic changes of bilateral hip joints with large
subchondral cysts at left femoral head.
Degenerative disc disease changes and scoliosis thoracolumbar
spine.
IMPRESSION: Right hydronephrosis secondary to a 5 mm diameter right UPJ
calculus.

## 2012-05-19 ENCOUNTER — Ambulatory Visit: Payer: Managed Care, Other (non HMO) | Admitting: Orthopedic Surgery

## 2012-05-25 ENCOUNTER — Ambulatory Visit (INDEPENDENT_AMBULATORY_CARE_PROVIDER_SITE_OTHER): Payer: Managed Care, Other (non HMO) | Admitting: Orthopedic Surgery

## 2012-05-25 ENCOUNTER — Ambulatory Visit (INDEPENDENT_AMBULATORY_CARE_PROVIDER_SITE_OTHER): Payer: Managed Care, Other (non HMO)

## 2012-05-25 ENCOUNTER — Telehealth: Payer: Self-pay | Admitting: Orthopedic Surgery

## 2012-05-25 ENCOUNTER — Encounter: Payer: Self-pay | Admitting: Orthopedic Surgery

## 2012-05-25 VITALS — BP 160/98 | Ht <= 58 in | Wt 161.0 lb

## 2012-05-25 DIAGNOSIS — M1710 Unilateral primary osteoarthritis, unspecified knee: Secondary | ICD-10-CM

## 2012-05-25 DIAGNOSIS — IMO0002 Reserved for concepts with insufficient information to code with codable children: Secondary | ICD-10-CM

## 2012-05-25 DIAGNOSIS — Z96659 Presence of unspecified artificial knee joint: Secondary | ICD-10-CM

## 2012-05-25 DIAGNOSIS — M1711 Unilateral primary osteoarthritis, right knee: Secondary | ICD-10-CM | POA: Insufficient documentation

## 2012-05-25 MED ORDER — POTASSIUM CHLORIDE CRYS ER 20 MEQ PO TBCR: 20.0000 meq | EXTENDED_RELEASE_TABLET | Freq: Two times a day (BID) | ORAL | Status: DC

## 2012-05-25 NOTE — Progress Notes (Signed)
Patient ID: Cindy George, female   DOB: 1950-06-18, 62 y.o.   MRN: [id number] Chief Complaint  Patient presents with  . Follow-up    1 year recheck on right knee replacement. DOS 05-19-11. Xrays today.    This is the 1 year followup status post right total knee arthroplasty. The patient had computerized navigation secondary to severe bowing of her femur and tibia.  She reports no specific problems at this time  Musculoskeletal review of systems is negative for any illness disease or complaint  BP 160/98  Ht 4\' 10"  (1.473 m)  Wt 161 lb (73.029 kg)  BMI 33.66 kg/m2 Her overall appearance is normal. She is oriented x3 her mood and affect are normal her gait and station are normal. The right knee incision is clean dry and intact without sign of infection. Flexion ARC is 120 with normal medial and lateral stability.  An x-ray was ordered and it is interpreted as normal appearance with no loosening and stable knee replacement  Recommend followup in a year for repeat x-ray  Status post total knee replacement - Plan: DG Knee AP/LAT W/Sunrise Right  S/P total knee replacement  Osteoarthritis of right knee

## 2012-05-25 NOTE — Telephone Encounter (Signed)
Patient called back after leaving office from her office visit today; asking if Dr. Romeo Apple can refill the Potassium chloride prescription, or if this is to be handled by primary care physician?  Her pharmacy is [facility] in [place]; patient phone# is [phone].

## 2012-05-25 NOTE — Telephone Encounter (Signed)
Fax script to pharmacy

## 2012-05-25 NOTE — Telephone Encounter (Signed)
Nurse has faxed to pharmacy and calling patient to notify.

## 2012-05-25 NOTE — Telephone Encounter (Signed)
Yes this time but primary care next time

## 2012-05-25 NOTE — Patient Instructions (Signed)
activities as tolerated 

## 2013-05-25 ENCOUNTER — Ambulatory Visit: Payer: Managed Care, Other (non HMO) | Admitting: Orthopedic Surgery

## 2014-04-07 DIAGNOSIS — Z8709 Personal history of other diseases of the respiratory system: Secondary | ICD-10-CM

## 2014-04-07 HISTORY — DX: Personal history of other diseases of the respiratory system: Z87.09

## 2014-10-18 ENCOUNTER — Other Ambulatory Visit (HOSPITAL_COMMUNITY): Payer: Self-pay | Admitting: Family Medicine

## 2014-10-18 DIAGNOSIS — Z1231 Encounter for screening mammogram for malignant neoplasm of breast: Secondary | ICD-10-CM

## 2014-10-23 ENCOUNTER — Ambulatory Visit (HOSPITAL_COMMUNITY): Payer: Managed Care, Other (non HMO)

## 2014-10-26 ENCOUNTER — Ambulatory Visit (HOSPITAL_COMMUNITY): Payer: Managed Care, Other (non HMO)

## 2014-11-06 ENCOUNTER — Encounter: Payer: Self-pay | Admitting: *Deleted

## 2014-12-05 ENCOUNTER — Other Ambulatory Visit: Payer: Self-pay | Admitting: Women's Health

## 2014-12-19 ENCOUNTER — Other Ambulatory Visit: Payer: Self-pay | Admitting: Women's Health

## 2015-02-19 ENCOUNTER — Other Ambulatory Visit: Payer: Self-pay | Admitting: Women's Health

## 2015-04-18 DIAGNOSIS — J029 Acute pharyngitis, unspecified: Secondary | ICD-10-CM | POA: Diagnosis not present

## 2015-04-18 DIAGNOSIS — J329 Chronic sinusitis, unspecified: Secondary | ICD-10-CM | POA: Diagnosis not present

## 2015-04-18 DIAGNOSIS — J40 Bronchitis, not specified as acute or chronic: Secondary | ICD-10-CM | POA: Diagnosis not present

## 2015-04-24 DIAGNOSIS — B0052 Herpesviral keratitis: Secondary | ICD-10-CM | POA: Diagnosis not present

## 2015-04-27 DIAGNOSIS — B0052 Herpesviral keratitis: Secondary | ICD-10-CM | POA: Diagnosis not present

## 2015-05-03 DIAGNOSIS — B0052 Herpesviral keratitis: Secondary | ICD-10-CM | POA: Diagnosis not present

## 2015-08-30 DIAGNOSIS — I10 Essential (primary) hypertension: Secondary | ICD-10-CM | POA: Diagnosis not present

## 2015-08-30 DIAGNOSIS — Z1389 Encounter for screening for other disorder: Secondary | ICD-10-CM | POA: Diagnosis not present

## 2015-08-30 DIAGNOSIS — E6609 Other obesity due to excess calories: Secondary | ICD-10-CM | POA: Diagnosis not present

## 2015-08-30 DIAGNOSIS — Z6834 Body mass index (BMI) 34.0-34.9, adult: Secondary | ICD-10-CM | POA: Diagnosis not present

## 2015-08-30 DIAGNOSIS — E782 Mixed hyperlipidemia: Secondary | ICD-10-CM | POA: Diagnosis not present

## 2015-12-13 DIAGNOSIS — J40 Bronchitis, not specified as acute or chronic: Secondary | ICD-10-CM | POA: Diagnosis not present

## 2015-12-13 DIAGNOSIS — J069 Acute upper respiratory infection, unspecified: Secondary | ICD-10-CM | POA: Diagnosis not present

## 2015-12-13 DIAGNOSIS — J329 Chronic sinusitis, unspecified: Secondary | ICD-10-CM | POA: Diagnosis not present

## 2016-01-14 ENCOUNTER — Ambulatory Visit (INDEPENDENT_AMBULATORY_CARE_PROVIDER_SITE_OTHER): Payer: Medicare Other | Admitting: Orthopedic Surgery

## 2016-01-14 ENCOUNTER — Ambulatory Visit (INDEPENDENT_AMBULATORY_CARE_PROVIDER_SITE_OTHER): Payer: Medicare Other

## 2016-01-14 ENCOUNTER — Encounter: Payer: Self-pay | Admitting: Orthopedic Surgery

## 2016-01-14 VITALS — BP 137/75 | HR 120 | Resp 16 | Ht <= 58 in | Wt 158.6 lb

## 2016-01-14 DIAGNOSIS — M25552 Pain in left hip: Secondary | ICD-10-CM | POA: Diagnosis not present

## 2016-01-14 DIAGNOSIS — M87052 Idiopathic aseptic necrosis of left femur: Secondary | ICD-10-CM | POA: Diagnosis not present

## 2016-01-14 DIAGNOSIS — M1612 Unilateral primary osteoarthritis, left hip: Secondary | ICD-10-CM | POA: Diagnosis not present

## 2016-01-14 MED ORDER — TRAMADOL-ACETAMINOPHEN 37.5-325 MG PO TABS: 1.0000 | ORAL_TABLET | ORAL | 5 refills | Status: DC | PRN

## 2016-01-14 NOTE — Progress Notes (Signed)
Chief Complaint  Patient presents with  . Hip Pain    left with ambulation x few months    65 year old female presents with 6 week history of increasing left hip pain though she's had unequal leg lengths for some time and pain in her left hip for over a year  She tolerated right total knee arthroplasty very well several years ago. Presents now for evaluation of her left hip  She has no risk factors for avascular necrosis. She does not smoke or drink and is now retired  She did try some ibuprofen and is now concerned about the amount of ibuprofen that she is taken to control her hip pain. Her activities of daily living has declined as as has her overall activity secondary to increasing pain in the left hip   Hip Pain   The incident occurred more than 1 week ago. There was no injury mechanism. The pain is present in the left hip. The quality of the pain is described as aching and stabbing. The pain is moderate. The pain has been constant since onset. Associated symptoms include a loss of motion. Pertinent negatives include no inability to bear weight, loss of sensation, muscle weakness, numbness or tingling. The symptoms are aggravated by movement and weight bearing. She has tried NSAIDs for the symptoms. The treatment provided mild relief.    Review of Systems  Musculoskeletal: Positive for joint pain.  Neurological: Negative for tingling and numbness.  All other systems reviewed and are negative.   Past Medical History:  Diagnosis Date  . Arthritis   . High blood pressure   . High cholesterol   . Hypothyroidism   . Thyroid disorder     Past Surgical History:  Procedure Laterality Date  . colonscopy  2010  . KIDNEY STONE SURGERY  2011  . THYROID SURGERY    . TOTAL ABDOMINAL HYSTERECTOMY    . TOTAL KNEE ARTHROPLASTY  05/19/2011   Procedure: TOTAL KNEE ARTHROPLASTY;  Surgeon: Fuller CanadaStanley Kaikoa Magro, MD;  Location: [facility];  Service: Orthopedics;  Laterality: Right;  computer assisted  depuy   No family history on file. Social History  Substance Use Topics  . Smoking status: Never Smoker  . Smokeless tobacco: Never Used  . Alcohol use No   Current Meds  Medication Sig  . atorvastatin (LIPITOR) 10 MG tablet Take 10 mg by mouth daily.  . calcium citrate-vitamin D 200-200 MG-UNIT TABS Take 1 tablet by mouth 2 (two) times daily.  Marland Kitchen. levothyroxine (SYNTHROID, LEVOTHROID) 175 MCG tablet Take 175 mcg by mouth daily.  Marland Kitchen. loratadine (CLARITIN) 10 MG tablet Take 10 mg by mouth daily as needed. For allergies  . losartan-hydrochlorothiazide (HYZAAR) 100-12.5 MG per tablet Take 1 tablet by mouth daily.   . methocarbamol (ROBAXIN) 500 MG tablet   . Multiple Vitamin (MULITIVITAMIN WITH MINERALS) TABS Take 1 tablet by mouth daily.    BP 137/75   Pulse (!) 120   Resp 16   Ht 4\' 8"  (1.422 m)   Wt 158 lb 9.6 oz (71.9 kg)   BMI 35.56 kg/m   Physical Exam  Constitutional: She is oriented to person, place, and time. She appears well-developed and well-nourished. No distress.  Cardiovascular: Normal rate and intact distal pulses.   Neurological: She is alert and oriented to person, place, and time. She has normal reflexes. She exhibits normal muscle tone. Coordination normal.  Skin: Skin is warm and dry. No rash noted. She is not diaphoretic. No erythema. No pallor.  Psychiatric:  She has a normal mood and affect. Her behavior is normal. Judgment and thought content normal.    Ortho Exam  She has an extremely altered gait she has a Trendelenburg gait. She has a painful antalgic gait favoring her left lower extremity  Her leg lengths are abnormal. She cannot really sit on her left hip due to pain  She has resting position of external rotation of the left leg secondary to the hip she has no internal rotation. Her hip flexion is noted for severe grinding moderate pain. There is no detectable instability muscle tone is normal. Skin is normal as well without rash or erythema  Distal  pulses are intact bilaterally and she has normal sensation in the left leg  She does not use a support   ASSESSMENT: My personal interpretation of the images:  Today's AP pelvis and lateral hip show severe arthritis of left hip. 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  To be discussed anterior hip approach. We have asked her to call us a month before she is ready for surgery to arrange for appointment with Dr. Magnus Ivan if necessary or if she wants to have it here laterally can do it as well.  Fuller Canada, MD 01/14/2016 9:26 AM

## 2016-01-14 NOTE — Patient Instructions (Addendum)
Avascular Necrosis Avascular necrosis is a disease resulting from the temporary or permanent loss of blood supply to a bone. This disease may also be known as:   Osteonecrosis.   Aseptic necrosis.   Ischemic bone necrosis. Without proper blood supply, the internal layer of the affected bone dies and the outer layer of the bone may break down. If this process affects a bone near a joint, it may lead to collapse of that joint. Common bones that are affected by this condition include:  The top of your thigh bone (femoral head).  One or more bones in your wrist (scaphoid orlunate).  One or more bones in your foot (metatarsals).  One of the bones in your ankle (navicular). The joint most commonly affected by this condition is the hip joint. Avascular necrosis rarely occurs in more than one bone at a time.  CAUSES   Damage or injury to a bone or joint.  Using corticosteroid medicine for a long period of time.  Changes in your immune or hormone systems.  Excessive exposure to radiation. RISK FACTORS  Alcohol abuse.  Previous traumatic injury to a joint.  Using corticosteroid medicines for a long period of time or often.  Having a medical condition such as:  HIV or AIDS.   Diabetes.   Sickle cell disease.  An autoimmune disease. SIGNS AND SYMPTOMS  The main symptoms of avascular necrosis are pain and decreased motion in the affected bone or joint. In the early stages the pain may be minor and occur only with activity. As avascular necrosis progresses, pain may gradually worsen and occur while at rest. The pain may suddenly become severe if an affected joint collapses. DIAGNOSIS  Avascular necrosis may be diagnosed with:   A medical history.  A physical exam.   X-rays.  An MRI.  A bone scan. TREATMENT  Treatments may include:  Medicine to help relieve pain.  Avoiding placing any pressure or weight ontheaffected area. If avascular necrosis occurs in  your hip, ankle, or foot, you may be instructed to use crutches or a rolling scooter.  Surgery, such as:   Core decompression. In this surgery, one or more holes are placed in the bone for new blood vessels to grow into. This provides a renewed blood supply to the bone. Core decompression can often reduce pain and pressure in the affected bone and slow the progression of bone and joint destruction.  Osteotomy. In this surgery, the bone is reshaped to reduce stress on the affected area of the joint.   Bone grafting. In this surgery, healthy bone from one part of your body is transplanted to the affected area.   Arthroplasty. Arthroplasty is also known as total joint replacement. In this surgery, the affected surface on one or both sides of a joint is replaced with artificial parts (prostheses).  Electrical stimulation. This may help encourage new bone growth. HOME CARE INSTRUCTIONS  Take medicines only as directed by your health care provider.   Follow your health care provider's recommendations on limiting activities or using crutches to rest your affected joint.   Meet with aphysical therapist as directed by your health care provider.   Keep all follow-up visits as directed by your health care provider. This is important. SEEK MEDICAL CARE IF:   Your pain worsens.  You have decreased motion in your affected joint. SEEK IMMEDIATE MEDICAL CARE IF:  Your pain suddenly becomes severe.   This information is not intended to replace advice given to you  by your health care provider. Make sure you discuss any questions you have with your health care provider.   Document Released: 09/13/2001 Document Revised: 04/14/2014 Document Reviewed: 06/01/2013 Elsevier Interactive Patient Education 2016 Elsevier Inc.  Preparing for Hip Replacement Recovery from hip replacement surgery can be made easier and more comfortable by being prepared before surgery. This includes:   Arranging for  others to help you.  Preparing your home.  Preparing your body by having a preoperative exam and being as healthy as you can.  Doing exercises before your surgery as directed by your health care provider. You can ease any concerns about your financial responsibilities by calling your insurance company after you decide to have surgery. In addition to asking about your surgery and hospital stay, you will want to ask about coverage for medical equipment, rehabilitation facilities, and home care.  HOW SHOULD I ARRANGE FOR HELP? You will be stronger and more mobile every day. However, in the first couple weeks after surgery, it is unlikely you will be able to do all your daily activities as easily as before your surgery. You may tire easily and will still have limited movement in your leg. Follow these guidelines to best arrange for the help you may need after your surgery:  Plan to have someone take you home after the procedure. Your health care provider will be able to tell you how many days you can expect to be in the hospital.  Cancel all work, caregiving, and volunteer responsibilities for at least 4-6 weeks after surgery.  If you live alone, arrange for someone to care for your home and pets for the first 4-6 weeks after surgery.  Select someone with whom you feel comfortable to be with you day and night for the first week. This person will help you with your exercises and personal care, such as bathing and using the toilet.  Arrange for drivers to bring you to and from your follow-up appointments, the grocery store, and other places you may need to go for at least 4-6 weeks. HOW SHOULD I PREPARE MY HOME?   Pick a recovery spot, but do not plan on recovering in bed. Sitting in a more upright position is better for your health. You may want to use a recliner with a small table nearby. Choose a chair with a firm seat that will not allow you to sink down into it. Chairs and sofas that are too  soft can allow your hip to bend at an angle greater than 90 degrees. This could put you at risk for dislocating your new hip joint. Place the items you use most frequently on the small table. These may include the TV remote, a cordless phone, a book or laptop computer, a water glass, and any other items of your choice.  Remove all clutter from your floors. Also remove any throw rugs.  To see if you will be able to move in your home with a wheeled walker, hold your hands out about 6 inches (15 cm) from your sides. Walk from your recovery spot to your kitchen and bathroom. Then walk from your bed to the bathroom. If you do not hit anything with your hands, you have enough room.  Move the items you use most often in your kitchen, bathroom, and bedroom to shelves and drawers that are at countertop height.  Prepare a few meals to freeze and reheat later.  Consider adding grab bars in the shower and near the toilet.  While  you are in the hospital, you will learn about equipment that can be helpful for your recovery. Some of the equipment includes raised toilet seats, tub benches, and shower benches. HOW SHOULD I PREPARE MY BODY?   Have a preoperative exam. This will ensure that your body is healthy enough to safely have this surgery. Bring a complete list of all your medicines and supplements, including herbs and vitamins. You may need to have additional tests to ensure your safety.  Have elective dental care and routine cleanings before your surgery. Germs from anywhere in your body, including your mouth, can travel to your new joint and infect it. It is important not to have any dental work performed for at least 3 months after your surgery. After surgery, be sure to tell your dentist about your joint replacement.  Maintain a healthy diet. Do not change your diet before surgery unless advised to do so by your health care provider.  Do not use any tobacco products, including cigarettes, chewing  tobacco, or electronic cigarettes. If you need help quitting, ask your health care provider. Tobacco and nicotine products can delay healing after your surgery.  The day before your surgery, follow your health care provider's directions for showering, eating, drinking, and taking medicines. These directions are for your safety. WHAT KINDS OF EXERCISES SHOULD I DO?  Your health care provider may have you do the following exercises before your surgery. Be sure to follow the exercise program only as directed by your health care provider. While completing these exercises, remember to stretch for as long as you can, up to 30 seconds. You should only feel a gentle lengthening or release in the stretched tissue. You should not feel pain.  Ankle Pumps 1. While sitting on a firm surface with your legs straight out in front of you, move your feet at the ankle joints so that your toes are pulling back toward your chest. 2. Reverse the motion, pointing your toes away from you. 3. Repeat 10-20 times. Complete this exercise 1-2 times per day.  Heel Slides 1. Lie on your back with both knees straight. (If this causes back pain, bend one knee, placing your foot flat on the floor. Keep this leg in this position while doing heel slides with the opposite leg.) 2. Slowly slide one heel back toward your buttocks until you feel a gentle stretch in the front of your knee or thigh. 3. Slowly slide your heel back to the starting position. 4. Repeat 10-20 times, then switch heels and do it again. Complete this exercise 1-2 times per day. Quadriceps Sets 1. Lie on your back with one leg extended and your opposite knee bent. 2. Gradually tighten the muscles in the front of the thigh of your extended leg. This motion will push the back of the knee down toward the floor. 3. Hold the muscle as tightly as you can without causing pain for 10 seconds. 4. Relax the muscles slowly and completely in between each  repetition. 5. Repeat 10-20 times, then switch legs and do it again. Complete this exercise 1-2 times per day. Short Arc Kicks  1. Lie on your back. Place a rolled towel (4-6 inches [10-15 cm] high) under one knee so that the knee slightly bends. 2. Raise only the lower leg of your slightly bent leg by tightening the muscles in the front of your thigh. Do not allow your thigh to rise. 3. Hold this position for 5 seconds. 4. Repeat 10-20 times, then switch  legs and do it again. Complete this exercise 1-2 times per day. Straight Leg Raises 1. Lie on your back with one leg extended and your opposite knee bent. 2. Tighten the muscles in the front of the thigh of your extended leg. Your thigh may shake slightly.  3. Tighten these muscles even more and raise your leg 4-6 inches off the floor. Hold for 3-5 seconds. 4. Keeping these muscles tight, lower your leg. 5. Relax the muscles slowly and completely in between each repetition. 6. Repeat 10-20 times, then switch legs and do it again. Complete this exercise 1-2 times per day.  Arm Chair Push-ups 1. Find a firm, non-wheeled chair with solid armrests. 2. Sitting in the chair, extend one leg straight out in front of you. 3. Lift up your body weight, using your arms and opposite leg. 4. Slowly lower your body weight. 5. Repeat 10-20 times, then switch legs and do it again. Complete this exercise 1-2 times per day.   This information is not intended to replace advice given to you by your health care provider. Make sure you discuss any questions you have with your health care provider.   Document Released: 06/28/2010 Document Revised: 04/14/2014 Document Reviewed: 06/16/2013 Elsevier Interactive Patient Education [employer]2016 Elsevier Inc.

## 2016-01-16 ENCOUNTER — Telehealth: Payer: Self-pay | Admitting: Orthopedic Surgery

## 2016-01-16 NOTE — Telephone Encounter (Signed)
Patient called, inquiring about being referred to Dr Cindy George for her hip surgery.  States Dr Romeo AppleHarrison had discussed this option at time of office visit 01/14/16.  Please advise. Patient's ph# is [phone]

## 2016-01-17 ENCOUNTER — Other Ambulatory Visit: Payer: Self-pay | Admitting: *Deleted

## 2016-01-17 DIAGNOSIS — M87052 Idiopathic aseptic necrosis of left femur: Secondary | ICD-10-CM

## 2016-01-17 NOTE — Telephone Encounter (Signed)
Referral faxed

## 2016-02-13 ENCOUNTER — Ambulatory Visit (INDEPENDENT_AMBULATORY_CARE_PROVIDER_SITE_OTHER): Payer: Medicare Other | Admitting: Orthopaedic Surgery

## 2016-02-13 VITALS — Resp 20 | Ht <= 58 in | Wt 153.0 lb

## 2016-02-13 DIAGNOSIS — M25552 Pain in left hip: Secondary | ICD-10-CM

## 2016-02-13 DIAGNOSIS — M1612 Unilateral primary osteoarthritis, left hip: Secondary | ICD-10-CM | POA: Diagnosis not present

## 2016-02-13 NOTE — Progress Notes (Signed)
Office Visit Note   Patient: Cindy George           Date of Birth: 01-14-1951           MRN: [id number] Visit Date: 02/13/2016              Requested by: Vickki HearingStanley E Harrison, MD [address] [place], [place] [id number] PCP: Colette RibasGOLDING, JOHN CABOT, MD   Assessment & Plan: Visit Diagnoses:  1. Unilateral primary osteoarthritis, left hip   2. 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 I do feel that we can perform a hip replacement on her but this may need to be anterior lateral and so direct anterior. I would certainly try direct anterior first and depending on intraoperative findings negative determination whether or not we will need to get her in more lateral position. She is issued in having this done sometime a after Christmas. I showed her hip model and her x-rays and explained in detail with the surgical process involves including a detailed discussion of the risks and benefits of surgery. We will see her back in 2 weeks after surgery but no x-rays are being needed.  Follow-Up Instructions: Return for 2 weeks post-op.   Orders:  No orders of the defined types were placed in this encounter.  No orders of the defined types were placed in this encounter.     Procedures: No procedures performed   Clinical Data: No additional findings.   Subjective: Chief Complaint  Patient presents with  . Left Hip - Pain    Patient referred from [facility] to discuss left hip replacement. Bone on bone   Ms. Southard is sent by Dr. Servando SalinaStanley Harrison  to evaluate her left hip. She's been having pain for several years now but has gotten more painful over the last 2-3 years in the last few months as well. She does not angulate with any type of assistive device but she has a significant limp. Her pain is in the groin and she's been told that she will likely need a hip replacement. At this point her pain can be  10 out of 10 at times. It is detrimentally affects her activity is daily living, her quality of life, and her mobility. HPI  Review of Systems She denies any chest pain, shortness of breath, fever, chills, nausea, vomiting.  Objective: Vital Signs: Resp 20   Ht 4\' 10"  (1.473 m)   Wt 153 lb (69.4 kg)   BMI 31.98 kg/m   Physical Exam She is alert and oriented 3 in no acute distress robs discomfort Ortho Exam Examination of her left hip that shows almost essentially no internal or external rotation. She has significant leg length discrepancy with her left leg shorter than her right leg. Her right hip exam shows full range of motion. Specialty Comments:  No specialty comments available.  Imaging: No results found. 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 She has a shallow acetabulum and a deformed femoral head. She has a short femoral neck as well.  PMFS History: Patient Active Problem List   Diagnosis Date Noted  . Osteoarthritis of right knee 05/25/2012  . Stiffness of joint, not elsewhere classified, lower leg 06/30/2011  . Swelling of joint of right knee 06/30/2011  . Difficulty in walking(719.7) 06/30/2011  . Weakness of right leg 06/30/2011  .  S/P total knee replacement 06/02/2011  . ARTHRITIS, RIGHT KNEE 09/10/2009  . BUNION, LEFT FOOT 09/10/2009  . HAMMER TOE 09/10/2009  . CLOSED FRACTURE OF UPPER END OF FIBULA 07/16/2009  . HIGH BLOOD PRESSURE 07/16/2009   Past Medical History:  Diagnosis Date  . Arthritis   . High blood pressure   . High cholesterol   . Hypothyroidism   . Thyroid disorder     No family history on file.  Past Surgical History:  Procedure Laterality Date  . colonscopy  2010  . KIDNEY STONE SURGERY  2011  . THYROID SURGERY    . TOTAL ABDOMINAL HYSTERECTOMY    . TOTAL KNEE ARTHROPLASTY  05/19/2011   Procedure: TOTAL KNEE ARTHROPLASTY;  Surgeon: Fuller CanadaStanley Harrison, MD;  Location: [facility];  Service:  Orthopedics;  Laterality: Right;  computer assisted depuy   Social History   Occupational History  . Not on file.   Social History Main Topics  . Smoking status: Never Smoker  . Smokeless tobacco: Never Used  . Alcohol use No  . Drug use: No  . Sexual activity: No

## 2016-03-03 ENCOUNTER — Telehealth (INDEPENDENT_AMBULATORY_CARE_PROVIDER_SITE_OTHER): Payer: Self-pay | Admitting: *Deleted

## 2016-03-03 NOTE — Telephone Encounter (Signed)
ERROR

## 2016-03-04 ENCOUNTER — Ambulatory Visit (INDEPENDENT_AMBULATORY_CARE_PROVIDER_SITE_OTHER): Payer: Medicare Other | Admitting: Orthopaedic Surgery

## 2016-03-04 DIAGNOSIS — M25562 Pain in left knee: Secondary | ICD-10-CM | POA: Diagnosis not present

## 2016-03-04 DIAGNOSIS — M25552 Pain in left hip: Secondary | ICD-10-CM

## 2016-03-04 NOTE — Progress Notes (Signed)
Office Visit Note   Patient: Cindy George           Date of Birth: 12-02-1950           MRN: [id number] Visit Date: 03/04/2016              Requested by: Assunta FoundJohn Golding, MD [address] [place], [place] [address] PCP: Colette RibasGOLDING, JOHN CABOT, MD   Assessment & Plan: Visit Diagnoses: No diagnosis found.  Plan: I gave her reassurance that her left hip and left knee were doing well. She is scheduled for hip replacement surgery on January 2. She does be quite difficult due to her congenital deformity. We would then see her back 2 weeks after her surgery.  Follow-Up Instructions: Return for 2 weeks post-op.   Orders:  No orders of the defined types were placed in this encounter.  No orders of the defined types were placed in this encounter.     Procedures: No procedures performed   Clinical Data: No additional findings.   Subjective: Chief Complaint  Patient presents with  . Left Hip - Injury    Patient tripped over dog over the weekend, she doesn't hurt near as bad today as she did this weekend--but since she is having hip surgery soon she just wanted to check     HPI She says it her left hip in her left knee hurt quite a bit after she fell and this was scales appointment she is ambulating with a cane today and says she feels significantly better over the last day. She still wanted to come in just to make sure everything was fine. She scheduled for hip replacement surgery on 04/08/2016. Review of Systems She denies any chest pain, headache, shortness of breath, fever, chills, nausea, vomiting.  Objective: Vital Signs: There were no vitals taken for this visit.  Physical Exam She is alert and oriented 3 in no acute distress Ortho Exam Exam of the cane and has a significant limp to her left side. 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 Her left knee does show a little bit of bruising and abrasion anteriorly but it  feels MC stable. Her hip exam shows limited motion but this is no different than her previous exam. Specialty Comments:  No specialty comments available.  Imaging: No results found.   PMFS History: Patient Active Problem List   Diagnosis Date Noted  . Osteoarthritis of right knee 05/25/2012  . Stiffness of joint, not elsewhere classified, lower leg 06/30/2011  . Swelling of joint of right knee 06/30/2011  . Difficulty in walking(719.7) 06/30/2011  . Weakness of right leg 06/30/2011  . S/P total knee replacement 06/02/2011  . ARTHRITIS, RIGHT KNEE 09/10/2009  . BUNION, LEFT FOOT 09/10/2009  . HAMMER TOE 09/10/2009  . CLOSED FRACTURE OF UPPER END OF FIBULA 07/16/2009  . HIGH BLOOD PRESSURE 07/16/2009   Past Medical History:  Diagnosis Date  . Arthritis   . High blood pressure   . High cholesterol   . Hypothyroidism   . Thyroid disorder     No family history on file.  Past Surgical History:  Procedure Laterality Date  . colonscopy  2010  . KIDNEY STONE SURGERY  2011  . THYROID SURGERY    . TOTAL ABDOMINAL HYSTERECTOMY    . TOTAL KNEE ARTHROPLASTY  05/19/2011   Procedure: TOTAL KNEE ARTHROPLASTY;  Surgeon: Fuller CanadaStanley Harrison, MD;  Location: [facility];  Service: Orthopedics;  Laterality: Right;  computer assisted depuy   Social History   Occupational History  . Not on file.   Social History Main Topics  . Smoking status: Never Smoker  . Smokeless tobacco: Never Used  . Alcohol use No  . Drug use: No  . Sexual activity: No

## 2016-03-26 ENCOUNTER — Other Ambulatory Visit (INDEPENDENT_AMBULATORY_CARE_PROVIDER_SITE_OTHER): Payer: Self-pay | Admitting: Physician Assistant

## 2016-03-28 ENCOUNTER — Encounter (HOSPITAL_COMMUNITY): Payer: Self-pay

## 2016-03-28 ENCOUNTER — Encounter (HOSPITAL_COMMUNITY)
Admission: RE | Admit: 2016-03-28 | Discharge: 2016-03-28 | Disposition: A | Discharge status: Home / Self Care | Payer: Medicare Other | Source: Ambulatory Visit | Attending: Orthopaedic Surgery | Admitting: Orthopaedic Surgery

## 2016-03-28 DIAGNOSIS — Z01812 Encounter for preprocedural laboratory examination: Secondary | ICD-10-CM | POA: Diagnosis not present

## 2016-03-28 DIAGNOSIS — Q6589 Other specified congenital deformities of hip: Secondary | ICD-10-CM | POA: Diagnosis not present

## 2016-03-28 DIAGNOSIS — I493 Ventricular premature depolarization: Secondary | ICD-10-CM | POA: Insufficient documentation

## 2016-03-28 DIAGNOSIS — Z01818 Encounter for other preprocedural examination: Secondary | ICD-10-CM | POA: Insufficient documentation

## 2016-03-28 HISTORY — DX: Pneumonia, unspecified organism: J18.9

## 2016-03-28 HISTORY — DX: Other complications of anesthesia, initial encounter: T88.59XA

## 2016-03-28 HISTORY — DX: Personal history of other diseases of the respiratory system: Z87.09

## 2016-03-28 HISTORY — DX: Urgency of urination: R39.15

## 2016-03-28 HISTORY — DX: Anemia, unspecified: D64.9

## 2016-03-28 HISTORY — DX: Pain in unspecified joint: M25.50

## 2016-03-28 HISTORY — DX: Adverse effect of unspecified anesthetic, initial encounter: T41.45XA

## 2016-03-28 HISTORY — DX: Personal history of urinary calculi: Z87.442

## 2016-03-28 LAB — BASIC METABOLIC PANEL
ANION GAP: 10 (ref 5–15)
BUN: 14 mg/dL (ref 6–20)
CHLORIDE: 105 mmol/L (ref 101–111)
CO2: 25 mmol/L (ref 22–32)
Calcium: 10 mg/dL (ref 8.9–10.3)
Creatinine, Ser: 0.75 mg/dL (ref 0.44–1.00)
GFR calc Af Amer: >60 mL/min (ref >60)
GFR calc non Af Amer: >60 mL/min (ref >60)
GLUCOSE: 105 mg/dL — AB (ref 65–99)
POTASSIUM: 3.5 mmol/L (ref 3.5–5.1)
Sodium: 140 mmol/L (ref 135–145)

## 2016-03-28 LAB — CBC
HEMATOCRIT: 39.3 % (ref 36.0–46.0)
HEMOGLOBIN: 13.3 g/dL (ref 12.0–15.0)
MCH: 30 pg (ref 26.0–34.0)
MCHC: 33.8 g/dL (ref 30.0–36.0)
MCV: 88.5 fL (ref 78.0–100.0)
Platelets: 187 10*3/uL (ref 150–400)
RBC: 4.44 MIL/uL (ref 3.87–5.11)
RDW: 12.8 % (ref 11.5–15.5)
WBC: 4.5 10*3/uL (ref 4.0–10.5)

## 2016-03-28 LAB — SURGICAL PCR SCREEN
MRSA, PCR: NEGATIVE
Staphylococcus aureus: NEGATIVE

## 2016-03-28 MED ORDER — CHLORHEXIDINE GLUCONATE 4 % EX LIQD: 60.0000 mL | Freq: Once | CUTANEOUS | Status: DC

## 2016-03-28 NOTE — Progress Notes (Addendum)
Cardiologist denies  Medical Md is Dr.John Phillips OdorGolding  Echo denies  Stress test denies  Heart cath denies  EKG denies in past yr  CXR denies in past yr

## 2016-03-28 NOTE — Pre-Procedure Instructions (Signed)
Cindy McmurrayMargaret H George  03/28/2016      [facility] - [place], [place] - [address]  [place] [id number] Phone: [phone] Fax: [phone]    Your procedure is scheduled on Tues, Jan 2 @ 12:15 PM  [facility] Admitting at 10:15 AM  Call this number if you have problems the morning of surgery:  [phone]   Remember:  Do not eat food or drink liquids after midnight.  Take these medicines the morning of surgery with A SIP OF WATER Synthroid(Levothyroxine) and Tramadol(Ultram-if needed)              A week prior to surgery stop taking your Fish Oil along with any Vitamins or Herbal Medications. No Goody's,BC's,Aleve,Advil,Motrin.   Do not wear jewelry, make-up or nail polish.  Do not wear lotions, powders,  perfumes, or deoderant.  Do not shave 48 hours prior to surgery.    Do not bring valuables to the hospital.  [facility] any belongings or valuables.  Contacts, dentures or bridgework may not be worn into surgery.  Leave your suitcase in the car.  After surgery it may be brought to your room.  For patients admitted to the hospital, discharge time will be determined by your treatment team.  Patients discharged the day of surgery will not be allowed to drive home.    [facility]  Before surgery, you can play an important role.  Because skin is not sterile, your skin needs to be as free of germs as possible.  You can reduce the number of germs on you skin by washing with CHG (chlorahexidine gluconate) soap before surgery.  CHG is an antiseptic cleaner which kills germs and bonds with the skin to continue killing germs even after washing.  Please DO NOT use if you have an allergy to CHG or antibacterial soaps.  If your skin becomes reddened/irritated stop using the CHG and inform your nurse when you arrive at Short Stay.  Do not shave (including legs  and underarms) for at least 48 hours prior to the first CHG shower.  You may shave your face.  Please follow these instructions carefully:   1.  Shower with CHG Soap the night before surgery and the                                morning of Surgery.  2.  If you choose to wash your hair, wash your hair first as usual with your       normal shampoo.  3.  After you shampoo, rinse your hair and body thoroughly to remove the                      Shampoo.  4.  Use CHG as you would any other liquid soap.  You can apply chg directly       to the skin and wash gently with scrungie or a clean washcloth.  5.  Apply the CHG Soap to your body ONLY FROM THE NECK DOWN.        Do not use on open wounds or open sores.  Avoid contact with your eyes,       ears, mouth and genitals (private parts).  Wash genitals (private parts)       with your normal soap.  6.  Wash thoroughly, paying special attention to the area where your surgery        will be performed.  7.  Thoroughly rinse your body with warm water from the neck down.  8.  DO NOT shower/wash with your normal soap after using and rinsing off       the CHG Soap.  9.  Pat yourself dry with a clean towel.            10.  Wear clean pajamas.            11.  Place clean sheets on your bed the night of your first shower and do not        sleep with pets.  Day of Surgery  Do not apply any lotions/deoderants the morning of surgery.  Please wear clean clothes to the hospital/surgery center.   Please read over the following fact sheets that you were given. Coughing and Deep Breathing, MRSA Information and Surgical Site Infection Prevention

## 2016-04-07 MED ORDER — CEFAZOLIN SODIUM-DEXTROSE 2-4 GM/100ML-% IV SOLN: 2.0000 g | INTRAVENOUS | Status: DC

## 2016-04-07 MED ORDER — TRANEXAMIC ACID 1000 MG/10ML IV SOLN
1000.0000 mg | INTRAVENOUS | Status: AC
  Filled 2016-04-07 (×2): qty 10

## 2016-04-08 ENCOUNTER — Inpatient Hospital Stay (HOSPITAL_COMMUNITY): Payer: Medicare Other | Admitting: Certified Registered Nurse Anesthetist

## 2016-04-08 ENCOUNTER — Encounter (HOSPITAL_COMMUNITY): Payer: Self-pay | Admitting: Certified Registered Nurse Anesthetist

## 2016-04-08 ENCOUNTER — Inpatient Hospital Stay (HOSPITAL_COMMUNITY): Payer: Medicare Other

## 2016-04-08 ENCOUNTER — Inpatient Hospital Stay (HOSPITAL_COMMUNITY)
Admission: RE | Admit: 2016-04-08 | Discharge: 2016-04-11 | DRG: 470 | Disposition: A | Discharge status: Home / Self Care | Payer: Medicare Other | Source: Ambulatory Visit | Attending: Orthopaedic Surgery | Admitting: Orthopaedic Surgery

## 2016-04-08 ENCOUNTER — Encounter (HOSPITAL_COMMUNITY)
Admission: RE | Disposition: A | Discharge status: Home / Self Care | Payer: Self-pay | Source: Ambulatory Visit | Attending: Orthopaedic Surgery

## 2016-04-08 DIAGNOSIS — Z96642 Presence of left artificial hip joint: Secondary | ICD-10-CM | POA: Diagnosis not present

## 2016-04-08 DIAGNOSIS — D649 Anemia, unspecified: Secondary | ICD-10-CM | POA: Diagnosis present

## 2016-04-08 DIAGNOSIS — M1612 Unilateral primary osteoarthritis, left hip: Secondary | ICD-10-CM | POA: Diagnosis not present

## 2016-04-08 DIAGNOSIS — Z7982 Long term (current) use of aspirin: Secondary | ICD-10-CM | POA: Diagnosis not present

## 2016-04-08 DIAGNOSIS — Z79899 Other long term (current) drug therapy: Secondary | ICD-10-CM | POA: Diagnosis not present

## 2016-04-08 DIAGNOSIS — Z79891 Long term (current) use of opiate analgesic: Secondary | ICD-10-CM

## 2016-04-08 DIAGNOSIS — Q6589 Other specified congenital deformities of hip: Secondary | ICD-10-CM | POA: Diagnosis not present

## 2016-04-08 DIAGNOSIS — M419 Scoliosis, unspecified: Secondary | ICD-10-CM | POA: Diagnosis not present

## 2016-04-08 DIAGNOSIS — M1632 Unilateral osteoarthritis resulting from hip dysplasia, left hip: Secondary | ICD-10-CM

## 2016-04-08 DIAGNOSIS — E7800 Pure hypercholesterolemia, unspecified: Secondary | ICD-10-CM | POA: Diagnosis not present

## 2016-04-08 DIAGNOSIS — I10 Essential (primary) hypertension: Secondary | ICD-10-CM | POA: Diagnosis not present

## 2016-04-08 DIAGNOSIS — Z419 Encounter for procedure for purposes other than remedying health state, unspecified: Secondary | ICD-10-CM

## 2016-04-08 DIAGNOSIS — Z23 Encounter for immunization: Secondary | ICD-10-CM

## 2016-04-08 DIAGNOSIS — Z96659 Presence of unspecified artificial knee joint: Secondary | ICD-10-CM | POA: Diagnosis not present

## 2016-04-08 DIAGNOSIS — S72112A Displaced fracture of greater trochanter of left femur, initial encounter for closed fracture: Secondary | ICD-10-CM | POA: Diagnosis not present

## 2016-04-08 DIAGNOSIS — Z471 Aftercare following joint replacement surgery: Secondary | ICD-10-CM | POA: Diagnosis not present

## 2016-04-08 DIAGNOSIS — E039 Hypothyroidism, unspecified: Secondary | ICD-10-CM | POA: Diagnosis present

## 2016-04-08 HISTORY — PX: TOTAL HIP ARTHROPLASTY: SHX124

## 2016-04-08 SURGERY — ARTHROPLASTY, HIP, TOTAL, ANTERIOR APPROACH
Anesthesia: General | Site: Hip | Laterality: Left

## 2016-04-08 MED ORDER — IRBESARTAN-HYDROCHLOROTHIAZIDE 300-12.5 MG PO TABS: 1.0000 | ORAL_TABLET | Freq: Every day | ORAL | Status: DC

## 2016-04-08 MED ORDER — MENTHOL 3 MG MT LOZG: 1.0000 | LOZENGE | OROMUCOSAL | Status: DC | PRN

## 2016-04-08 MED ORDER — DOCUSATE SODIUM 100 MG PO CAPS
100.0000 mg | ORAL_CAPSULE | Freq: Two times a day (BID) | ORAL | Status: DC
  Administered 2016-04-08 – 2016-04-11 (×6): 100 mg via ORAL
  Filled 2016-04-08 (×6): qty 1

## 2016-04-08 MED ORDER — KETAMINE HCL 10 MG/ML IJ SOLN
INTRAMUSCULAR | Status: DC | PRN
  Administered 2016-04-08 (×2): 20 mg via INTRAVENOUS
  Administered 2016-04-08: 10 mg via INTRAVENOUS

## 2016-04-08 MED ORDER — METOCLOPRAMIDE HCL 5 MG PO TABS: 5.0000 mg | ORAL_TABLET | Freq: Three times a day (TID) | ORAL | Status: DC | PRN

## 2016-04-08 MED ORDER — METHOCARBAMOL 500 MG PO TABS
ORAL_TABLET | ORAL | Status: AC
  Filled 2016-04-08: qty 1

## 2016-04-08 MED ORDER — PROPOFOL 10 MG/ML IV BOLUS
INTRAVENOUS | Status: DC | PRN
  Administered 2016-04-08: 10 mg via INTRAVENOUS
  Administered 2016-04-08: 50 mg via INTRAVENOUS
  Administered 2016-04-08: 30 mg via INTRAVENOUS
  Administered 2016-04-08: 110 mg via INTRAVENOUS

## 2016-04-08 MED ORDER — LIDOCAINE 2% (20 MG/ML) 5 ML SYRINGE
INTRAMUSCULAR | Status: AC
  Filled 2016-04-08: qty 5

## 2016-04-08 MED ORDER — ACETAMINOPHEN 650 MG RE SUPP: 650.0000 mg | Freq: Four times a day (QID) | RECTAL | Status: DC | PRN

## 2016-04-08 MED ORDER — HYDROMORPHONE HCL 1 MG/ML IJ SOLN
0.2500 mg | INTRAMUSCULAR | Status: DC | PRN
  Administered 2016-04-08 (×4): 0.5 mg via INTRAVENOUS

## 2016-04-08 MED ORDER — IRBESARTAN 300 MG PO TABS
300.0000 mg | ORAL_TABLET | Freq: Every day | ORAL | Status: DC
  Administered 2016-04-08 – 2016-04-11 (×3): 300 mg via ORAL
  Filled 2016-04-08 (×3): qty 1

## 2016-04-08 MED ORDER — CEFAZOLIN SODIUM-DEXTROSE 2-4 GM/100ML-% IV SOLN
INTRAVENOUS | Status: AC
  Filled 2016-04-08: qty 100

## 2016-04-08 MED ORDER — CEFAZOLIN SODIUM-DEXTROSE 2-3 GM-% IV SOLR
INTRAVENOUS | Status: DC | PRN
  Administered 2016-04-08: 2 g via INTRAVENOUS

## 2016-04-08 MED ORDER — ONDANSETRON HCL 4 MG/2ML IJ SOLN
INTRAMUSCULAR | Status: DC | PRN
  Administered 2016-04-08: 4 mg via INTRAVENOUS

## 2016-04-08 MED ORDER — PROPOFOL 500 MG/50ML IV EMUL
INTRAVENOUS | Status: DC | PRN
  Administered 2016-04-08: 25 ug/kg/min via INTRAVENOUS

## 2016-04-08 MED ORDER — PHENOL 1.4 % MT LIQD: 1.0000 | OROMUCOSAL | Status: DC | PRN

## 2016-04-08 MED ORDER — KETAMINE HCL 10 MG/ML IJ SOLN
INTRAMUSCULAR | Status: AC
  Filled 2016-04-08: qty 1

## 2016-04-08 MED ORDER — SUCCINYLCHOLINE CHLORIDE 20 MG/ML IJ SOLN
INTRAMUSCULAR | Status: DC | PRN
  Administered 2016-04-08: 100 mg via INTRAVENOUS

## 2016-04-08 MED ORDER — ARTIFICIAL TEARS OP OINT
TOPICAL_OINTMENT | OPHTHALMIC | Status: AC
  Filled 2016-04-08: qty 3.5

## 2016-04-08 MED ORDER — 0.9 % SODIUM CHLORIDE (POUR BTL) OPTIME
TOPICAL | Status: DC | PRN
  Administered 2016-04-08: 1000 mL

## 2016-04-08 MED ORDER — ACETAMINOPHEN 325 MG PO TABS
650.0000 mg | ORAL_TABLET | Freq: Four times a day (QID) | ORAL | Status: DC | PRN
  Administered 2016-04-10: 650 mg via ORAL
  Filled 2016-04-08: qty 2

## 2016-04-08 MED ORDER — GLYCOPYRROLATE 0.2 MG/ML IJ SOLN
INTRAMUSCULAR | Status: DC | PRN
  Administered 2016-04-08: 0.2 mg via INTRAVENOUS

## 2016-04-08 MED ORDER — MIDAZOLAM HCL 2 MG/2ML IJ SOLN
INTRAMUSCULAR | Status: AC
  Filled 2016-04-08: qty 2

## 2016-04-08 MED ORDER — MIDAZOLAM HCL 2 MG/2ML IJ SOLN
INTRAMUSCULAR | Status: DC | PRN
  Administered 2016-04-08 (×2): 1 mg via INTRAVENOUS

## 2016-04-08 MED ORDER — PROPOFOL 1000 MG/100ML IV EMUL
INTRAVENOUS | Status: AC
  Filled 2016-04-08: qty 100

## 2016-04-08 MED ORDER — ASPIRIN 81 MG PO CHEW
81.0000 mg | CHEWABLE_TABLET | Freq: Two times a day (BID) | ORAL | Status: DC
  Administered 2016-04-08 – 2016-04-11 (×6): 81 mg via ORAL
  Filled 2016-04-08 (×6): qty 1

## 2016-04-08 MED ORDER — SODIUM CHLORIDE 0.9 % IR SOLN
Status: DC | PRN
  Administered 2016-04-08: 3000 mL

## 2016-04-08 MED ORDER — HYDROMORPHONE HCL 1 MG/ML IJ SOLN
INTRAMUSCULAR | Status: AC
  Filled 2016-04-08: qty 0.5

## 2016-04-08 MED ORDER — LIDOCAINE HCL (CARDIAC) 20 MG/ML IV SOLN
INTRAVENOUS | Status: DC | PRN
  Administered 2016-04-08: 60 mg via INTRATRACHEAL

## 2016-04-08 MED ORDER — METHOCARBAMOL 1000 MG/10ML IJ SOLN
500.0000 mg | Freq: Four times a day (QID) | INTRAVENOUS | Status: DC | PRN
  Filled 2016-04-08: qty 5

## 2016-04-08 MED ORDER — ONDANSETRON HCL 4 MG/2ML IJ SOLN: 4.0000 mg | Freq: Four times a day (QID) | INTRAMUSCULAR | Status: DC | PRN

## 2016-04-08 MED ORDER — PHENYLEPHRINE HCL 10 MG/ML IJ SOLN
INTRAMUSCULAR | Status: DC | PRN
  Administered 2016-04-08: 25 ug/min via INTRAVENOUS

## 2016-04-08 MED ORDER — HYDROMORPHONE HCL 2 MG/ML IJ SOLN
0.5000 mg | INTRAMUSCULAR | Status: DC | PRN
  Administered 2016-04-08: 0.5 mg via INTRAVENOUS
  Filled 2016-04-08: qty 1

## 2016-04-08 MED ORDER — ZOLPIDEM TARTRATE 5 MG PO TABS: 5.0000 mg | ORAL_TABLET | Freq: Every evening | ORAL | Status: DC | PRN

## 2016-04-08 MED ORDER — TRANEXAMIC ACID 1000 MG/10ML IV SOLN
1000.0000 mg | INTRAVENOUS | Status: AC
  Administered 2016-04-08: 1000 mg via INTRAVENOUS

## 2016-04-08 MED ORDER — DIPHENHYDRAMINE HCL 12.5 MG/5ML PO ELIX: 12.5000 mg | ORAL_SOLUTION | ORAL | Status: DC | PRN

## 2016-04-08 MED ORDER — LACTATED RINGERS IV SOLN
INTRAVENOUS | Status: DC
  Administered 2016-04-08 (×3): via INTRAVENOUS

## 2016-04-08 MED ORDER — POTASSIUM CHLORIDE CRYS ER 20 MEQ PO TBCR
20.0000 meq | EXTENDED_RELEASE_TABLET | Freq: Two times a day (BID) | ORAL | Status: DC
  Administered 2016-04-08 – 2016-04-10 (×5): 20 meq via ORAL
  Filled 2016-04-08 (×6): qty 1

## 2016-04-08 MED ORDER — FENTANYL CITRATE (PF) 100 MCG/2ML IJ SOLN
INTRAMUSCULAR | Status: AC
  Filled 2016-04-08: qty 2

## 2016-04-08 MED ORDER — ONDANSETRON HCL 4 MG/2ML IJ SOLN: 4.0000 mg | Freq: Once | INTRAMUSCULAR | Status: DC | PRN

## 2016-04-08 MED ORDER — HYDROMORPHONE HCL 1 MG/ML IJ SOLN
0.2500 mg | INTRAMUSCULAR | Status: DC | PRN
  Administered 2016-04-08: 0.5 mg via INTRAVENOUS

## 2016-04-08 MED ORDER — SUCCINYLCHOLINE CHLORIDE 200 MG/10ML IV SOSY
PREFILLED_SYRINGE | INTRAVENOUS | Status: AC
  Filled 2016-04-08: qty 10

## 2016-04-08 MED ORDER — METOPROLOL TARTRATE 5 MG/5ML IV SOLN
INTRAVENOUS | Status: AC
  Filled 2016-04-08: qty 5

## 2016-04-08 MED ORDER — CEFAZOLIN IN D5W 1 GM/50ML IV SOLN
1.0000 g | Freq: Four times a day (QID) | INTRAVENOUS | Status: AC
  Administered 2016-04-08 – 2016-04-09 (×2): 1 g via INTRAVENOUS
  Filled 2016-04-08 (×2): qty 50

## 2016-04-08 MED ORDER — LEVOTHYROXINE SODIUM 75 MCG PO TABS
175.0000 ug | ORAL_TABLET | Freq: Every day | ORAL | Status: DC
  Administered 2016-04-09 – 2016-04-11 (×3): 175 ug via ORAL
  Filled 2016-04-08 (×3): qty 1

## 2016-04-08 MED ORDER — ARTIFICIAL TEARS OP OINT
TOPICAL_OINTMENT | OPHTHALMIC | Status: DC | PRN
  Administered 2016-04-08: 1 via OPHTHALMIC

## 2016-04-08 MED ORDER — ALUM & MAG HYDROXIDE-SIMETH 200-200-20 MG/5ML PO SUSP: 30.0000 mL | ORAL | Status: DC | PRN

## 2016-04-08 MED ORDER — METOPROLOL TARTARATE 1 MG/ML SYRINGE (5ML)
Status: DC | PRN
  Administered 2016-04-08 (×2): 1 mg via INTRAVENOUS

## 2016-04-08 MED ORDER — OXYCODONE HCL 5 MG PO TABS
5.0000 mg | ORAL_TABLET | ORAL | Status: DC | PRN
  Administered 2016-04-08 – 2016-04-11 (×14): 10 mg via ORAL
  Filled 2016-04-08 (×13): qty 2

## 2016-04-08 MED ORDER — ONDANSETRON HCL 4 MG PO TABS: 4.0000 mg | ORAL_TABLET | Freq: Four times a day (QID) | ORAL | Status: DC | PRN

## 2016-04-08 MED ORDER — KETOROLAC TROMETHAMINE 15 MG/ML IJ SOLN
7.5000 mg | Freq: Four times a day (QID) | INTRAMUSCULAR | Status: AC
  Administered 2016-04-08 – 2016-04-09 (×4): 7.5 mg via INTRAVENOUS
  Filled 2016-04-08 (×4): qty 1

## 2016-04-08 MED ORDER — HYDROMORPHONE HCL 2 MG/ML IJ SOLN
INTRAMUSCULAR | Status: AC
  Filled 2016-04-08: qty 1

## 2016-04-08 MED ORDER — SODIUM CHLORIDE 0.9 % IV SOLN: INTRAVENOUS | Status: DC

## 2016-04-08 MED ORDER — MEPERIDINE HCL 25 MG/ML IJ SOLN: 6.2500 mg | INTRAMUSCULAR | Status: DC | PRN

## 2016-04-08 MED ORDER — FENTANYL CITRATE (PF) 100 MCG/2ML IJ SOLN
INTRAMUSCULAR | Status: DC | PRN
  Administered 2016-04-08: 25 ug via INTRAVENOUS
  Administered 2016-04-08: 50 ug via INTRAVENOUS
  Administered 2016-04-08: 25 ug via INTRAVENOUS

## 2016-04-08 MED ORDER — OXYCODONE HCL 5 MG PO TABS
ORAL_TABLET | ORAL | Status: AC
  Filled 2016-04-08: qty 2

## 2016-04-08 MED ORDER — ATORVASTATIN CALCIUM 10 MG PO TABS
10.0000 mg | ORAL_TABLET | Freq: Every day | ORAL | Status: DC
  Administered 2016-04-08 – 2016-04-10 (×3): 10 mg via ORAL
  Filled 2016-04-08 (×3): qty 1

## 2016-04-08 MED ORDER — METOCLOPRAMIDE HCL 5 MG/ML IJ SOLN: 5.0000 mg | Freq: Three times a day (TID) | INTRAMUSCULAR | Status: DC | PRN

## 2016-04-08 MED ORDER — HYDROCHLOROTHIAZIDE 12.5 MG PO CAPS
12.5000 mg | ORAL_CAPSULE | Freq: Every day | ORAL | Status: DC
  Administered 2016-04-08 – 2016-04-11 (×3): 12.5 mg via ORAL
  Filled 2016-04-08 (×3): qty 1

## 2016-04-08 MED ORDER — ONDANSETRON HCL 4 MG/2ML IJ SOLN
INTRAMUSCULAR | Status: AC
  Filled 2016-04-08: qty 2

## 2016-04-08 MED ORDER — PHENYLEPHRINE HCL 10 MG/ML IJ SOLN
INTRAMUSCULAR | Status: DC | PRN
  Administered 2016-04-08: 160 ug via INTRAVENOUS
  Administered 2016-04-08: 120 ug via INTRAVENOUS
  Administered 2016-04-08: 80 ug via INTRAVENOUS

## 2016-04-08 MED ORDER — METHOCARBAMOL 500 MG PO TABS
500.0000 mg | ORAL_TABLET | Freq: Four times a day (QID) | ORAL | Status: DC | PRN
  Administered 2016-04-08 – 2016-04-11 (×5): 500 mg via ORAL
  Filled 2016-04-08 (×4): qty 1

## 2016-04-08 SURGICAL SUPPLY — 50 items
BENZOIN TINCTURE PRP APPL 2/3 (GAUZE/BANDAGES/DRESSINGS) ×2 IMPLANT
BLADE SAW SGTL 18X1.27X75 (BLADE) ×2 IMPLANT
BLADE SURG ROTATE 9660 (MISCELLANEOUS) IMPLANT
CAPT HIP TOTAL 2 ×2 IMPLANT
CELLS DAT CNTRL 66122 CELL SVR (MISCELLANEOUS) ×1 IMPLANT
COVER SURGICAL LIGHT HANDLE (MISCELLANEOUS) ×2 IMPLANT
DRAPE C-ARM 42X72 X-RAY (DRAPES) ×2 IMPLANT
DRAPE STERI IOBAN 125X83 (DRAPES) ×2 IMPLANT
DRAPE U-SHAPE 47X51 STRL (DRAPES) ×6 IMPLANT
DRSG AQUACEL AG ADV 3.5X10 (GAUZE/BANDAGES/DRESSINGS) ×2 IMPLANT
DURAPREP 26ML APPLICATOR (WOUND CARE) ×2 IMPLANT
ELECT BLADE 4.0 EZ CLEAN MEGAD (MISCELLANEOUS) ×2
ELECT BLADE 6.5 EXT (BLADE) IMPLANT
ELECT REM PT RETURN 9FT ADLT (ELECTROSURGICAL) ×2
ELECTRODE BLDE 4.0 EZ CLN MEGD (MISCELLANEOUS) ×1 IMPLANT
ELECTRODE REM PT RTRN 9FT ADLT (ELECTROSURGICAL) ×1 IMPLANT
FACESHIELD WRAPAROUND (MASK) ×4 IMPLANT
GAUZE XEROFORM 1X8 LF (GAUZE/BANDAGES/DRESSINGS) ×2 IMPLANT
GLOVE BIOGEL PI IND STRL 8 (GLOVE) ×2 IMPLANT
GLOVE BIOGEL PI INDICATOR 8 (GLOVE) ×2
GLOVE ECLIPSE 8.0 STRL XLNG CF (GLOVE) ×2 IMPLANT
GLOVE ORTHO TXT STRL SZ7.5 (GLOVE) ×4 IMPLANT
GOWN STRL REUS W/ TWL LRG LVL3 (GOWN DISPOSABLE) ×2 IMPLANT
GOWN STRL REUS W/ TWL XL LVL3 (GOWN DISPOSABLE) ×2 IMPLANT
GOWN STRL REUS W/TWL LRG LVL3 (GOWN DISPOSABLE) ×2
GOWN STRL REUS W/TWL XL LVL3 (GOWN DISPOSABLE) ×2
HANDPIECE INTERPULSE COAX TIP (DISPOSABLE) ×1
KIT BASIN OR (CUSTOM PROCEDURE TRAY) ×2 IMPLANT
KIT ROOM TURNOVER OR (KITS) ×2 IMPLANT
MANIFOLD NEPTUNE II (INSTRUMENTS) ×2 IMPLANT
NS IRRIG 1000ML POUR BTL (IV SOLUTION) ×2 IMPLANT
PACK TOTAL JOINT (CUSTOM PROCEDURE TRAY) ×2 IMPLANT
PAD ARMBOARD 7.5X6 YLW CONV (MISCELLANEOUS) ×2 IMPLANT
RTRCTR WOUND ALEXIS 18CM MED (MISCELLANEOUS) ×2
SET HNDPC FAN SPRY TIP SCT (DISPOSABLE) ×1 IMPLANT
STAPLER VISISTAT 35W (STAPLE) ×2 IMPLANT
STRIP CLOSURE SKIN 1/2X4 (GAUZE/BANDAGES/DRESSINGS) ×4 IMPLANT
SUT ETHIBOND NAB CT1 #1 30IN (SUTURE) ×2 IMPLANT
SUT MNCRL AB 4-0 PS2 18 (SUTURE) IMPLANT
SUT VIC AB 0 CT1 27 (SUTURE) ×1
SUT VIC AB 0 CT1 27XBRD ANBCTR (SUTURE) ×1 IMPLANT
SUT VIC AB 1 CT1 27 (SUTURE) ×1
SUT VIC AB 1 CT1 27XBRD ANBCTR (SUTURE) ×1 IMPLANT
SUT VIC AB 2-0 CT1 27 (SUTURE) ×1
SUT VIC AB 2-0 CT1 TAPERPNT 27 (SUTURE) ×1 IMPLANT
TOWEL OR 17X24 6PK STRL BLUE (TOWEL DISPOSABLE) ×2 IMPLANT
TOWEL OR 17X26 10 PK STRL BLUE (TOWEL DISPOSABLE) ×2 IMPLANT
TRAY CATH 16FR W/PLASTIC CATH (SET/KITS/TRAYS/PACK) IMPLANT
TRAY FOLEY CATH 16FRSI W/METER (SET/KITS/TRAYS/PACK) IMPLANT
WATER STERILE IRR 1000ML POUR (IV SOLUTION) ×4 IMPLANT

## 2016-04-08 NOTE — Transfer of Care (Signed)
Immediate Anesthesia Transfer of Care Note  Patient: Cindy George  Procedure(s) Performed: Procedure(s): LEFT TOTAL HIP ARTHROPLASTY ANTERIOR APPROACH (Left)  Patient Location: PACU  Anesthesia Type:General  Level of Consciousness: awake, alert , oriented and patient cooperative  Airway & Oxygen Therapy: Patient Spontanous Breathing and Patient connected to nasal cannula oxygen  Post-op Assessment: Report given to RN and Post -op Vital signs reviewed and stable  Post vital signs: Reviewed and stable  Last Vitals:  Vitals:   04/08/16 1041  BP: (!) 151/67  Pulse: (!) 108  Resp: 20  Temp: 36.7 C    Last Pain:  Vitals:   04/08/16 1041  TempSrc: Oral      Patients Stated Pain Goal: 6 (04/08/16 1044)  Complications: No apparent anesthesia complications

## 2016-04-08 NOTE — Brief Op Note (Signed)
04/08/2016  2:38 PM  PATIENT:  Cindy George  66 y.o. female  PRE-OPERATIVE DIAGNOSIS:  dysplasia left hip  POST-OPERATIVE DIAGNOSIS:  dysplasia left hip  PROCEDURE:  Procedure(s): LEFT TOTAL HIP ARTHROPLASTY ANTERIOR APPROACH (Left)  SURGEON:  Surgeon(s) and Role:    * Kathryne Hitchhristopher Y Shawnice Tilmon, MD - Primary  PHYSICIAN ASSISTANT: Rexene EdisonGil Clark, PA-C  ANESTHESIA:   general  EBL:  Total I/O In: 2000 [I.V.:2000] Out: 625 [Urine:275; Blood:350]  COUNTS:  YES  TOURNIQUET:  * No tourniquets in log *  DICTATION: .Other Dictation: Dictation Number [id number]  PLAN OF CARE: Admit to inpatient   PATIENT DISPOSITION:  PACU - hemodynamically stable.   Delay start of Pharmacological VTE agent (>24hrs) due to surgical blood loss or risk of bleeding: no

## 2016-04-08 NOTE — Anesthesia Procedure Notes (Signed)
Procedure Name: Intubation Date/Time: 04/08/2016 12:45 PM Performed by: Marcie Bal, ADAM Pre-anesthesia Checklist: Patient identified, Emergency Drugs available and Suction available Patient Re-evaluated:Patient Re-evaluated prior to inductionOxygen Delivery Method: Circle system utilized Preoxygenation: Pre-oxygenation with 100% oxygen Intubation Type: IV induction Ventilation: Mask ventilation without difficulty Laryngoscope Size: Mac and 3 Grade View: Grade I Tube type: Oral Tube size: 7.0 mm Number of attempts: 1 Airway Equipment and Method: Stylet Placement Confirmation: ETT inserted through vocal cords under direct vision,  positive ETCO2 and breath sounds checked- equal and bilateral Secured at: 22 cm Tube secured with: Tape Dental Injury: Teeth and Oropharynx as per pre-operative assessment

## 2016-04-08 NOTE — H&P (Signed)
TOTAL HIP ADMISSION H&P  Patient is admitted for left total hip arthroplasty.  Subjective:  Chief Complaint: left hip pain  HPI: Cindy George, 66 y.o. female, has a history of pain and functional disability in the left hip(s) due to arthritis and severe dysplastic hip and patient has failed non-surgical conservative treatments for greater than 12 weeks to include NSAID's and/or analgesics, corticosteriod injections, flexibility and strengthening excercises, use of assistive devices and activity modification.  Onset of symptoms was gradual starting >10 years ago with gradually worsening course since that time.The patient noted no past surgery on the left hip(s).  Patient currently rates pain in the left hip at 10 out of 10 with activity. Patient has night pain, worsening of pain with activity and weight bearing, trendelenberg gait, pain that interfers with activities of daily living and pain with passive range of motion. Patient has evidence of subchondral sclerosis, periarticular osteophytes, joint space narrowing and hip dysplasia by imaging studies. This condition presents safety issues increasing the risk of falls. This .  There is no current active infection.  Patient Active Problem List   Diagnosis Date Noted  . 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 Osteoarthritis of right knee 05/25/2012  . Stiffness of joint, not elsewhere classified, lower leg 06/30/2011  . Swelling of joint of right knee 06/30/2011  . Difficulty in walking(719.7) 06/30/2011  . Weakness of right leg 06/30/2011  . S/P total knee replacement 06/02/2011  . ARTHRITIS, RIGHT KNEE 09/10/2009  . BUNION, LEFT FOOT 09/10/2009  . HAMMER TOE 09/10/2009  . CLOSED FRACTURE OF UPPER END OF FIBULA 07/16/2009  . HIGH BLOOD PRESSURE 07/16/2009   Past Medical History:  Diagnosis Date  . Anemia    takes Ferrous Sulfate daily  . Arthritis   . Complication of anesthesia   . High blood  pressure    takes Avalide daily  . High cholesterol    takes Atorvastatin daily  . History of bronchitis 2016  . History of kidney stones   . Hypothyroidism    takes Synthroid daily  . Joint pain   . Pneumonia    at age 78  . Urinary urgency     Past Surgical History:  Procedure Laterality Date  . colonscopy  2010  . KIDNEY STONE SURGERY  2011  . THYROID SURGERY    . TOTAL ABDOMINAL HYSTERECTOMY    . TOTAL KNEE ARTHROPLASTY  05/19/2011   Procedure: TOTAL KNEE ARTHROPLASTY;  Surgeon: Fuller Canada, MD;  Location: [facility];  Service: Orthopedics;  Laterality: Right;  computer assisted depuy  . TUBAL LIGATION     27 yrs ago    Prescriptions Prior to Admission  Medication Sig Dispense Refill Last Dose  . acetaminophen (TYLENOL) 500 MG tablet Take 500 mg by mouth every 6 (six) hours as needed for mild pain.   Past Month at Unknown time  . aspirin EC 81 MG tablet Take 81 mg by mouth every other day.   Past Month at Unknown time  . atorvastatin (LIPITOR) 10 MG tablet Take 10 mg by mouth daily.   04/07/2016 at Unknown time  . calcium carbonate (OSCAL) 1500 (600 Ca) MG TABS tablet Take 600 mg of elemental calcium by mouth 2 (two) times a week.   Past Month at Unknown time  . Ferrous Sulfate (IRON) 325 (65 Fe) MG TABS Take 1 tablet by mouth 2 (two) times a week.   Past Month at Unknown time  . irbesartan-hydrochlorothiazide (  AVALIDE) 300-12.5 MG tablet Take 1 tablet by mouth daily.   04/07/2016 at Unknown time  . levothyroxine (SYNTHROID, LEVOTHROID) 175 MCG tablet Take 175 mcg by mouth daily.   04/08/2016 at 0700  . Multiple Vitamin (MULITIVITAMIN WITH MINERALS) TABS Take 1 tablet by mouth daily. With D3 Iron  calcium   Past Month at Unknown time  . Omega-3 Fatty Acids (FISH OIL PO) Take 1 capsule by mouth daily.   Past Month at Unknown time  . traMADol-acetaminophen (ULTRACET) 37.5-325 MG tablet Take 1 tablet by mouth every 4 (four) hours as needed. (Patient taking differently: Take 1 tablet by  mouth every 4 (four) hours as needed for moderate pain. ) 90 tablet 5 04/08/2016 at 0500  . HYDROcodone-acetaminophen (NORCO) 7.5-325 MG per tablet TAKE ONE TABLET BY MOUTH EVERY 4 HOURS AS NEEDED FOR PAIN (Patient not taking: Reported on 03/27/2016) 84 tablet 2 Not Taking at Unknown time  . potassium chloride SA (K-DUR,KLOR-CON) 20 MEQ tablet Take 1 tablet (20 mEq total) by mouth 2 (two) times daily. 60 tablet 1    No Known Allergies  Social History  Substance Use Topics  . Smoking status: Never Smoker  . Smokeless tobacco: Never Used  . Alcohol use No    History reviewed. No pertinent family history.   Review of Systems  Musculoskeletal: Positive for joint pain.  All other systems reviewed and are negative.   Objective:  Physical Exam  Constitutional: She is oriented to person, place, and time. She appears well-developed and well-nourished.  HENT:  Head: Normocephalic and atraumatic.  Eyes: EOM are normal. Pupils are equal, round, and reactive to light.  Neck: Normal range of motion. Neck supple.  Cardiovascular: Normal rate and regular rhythm.   Respiratory: Effort normal and breath sounds normal.  GI: Soft. Bowel sounds are normal.  Musculoskeletal:       Left hip: She exhibits decreased range of motion, decreased strength, tenderness, bony tenderness and deformity.  Neurological: She is alert and oriented to person, place, and time.  Skin: Skin is warm and dry.  Psychiatric: She has a normal mood and affect.    Vital signs in last 24 hours: Temp:  [98.1 F (36.7 C)] 98.1 F (36.7 C) (01/02 1041) Pulse Rate:  [108] 108 (01/02 1041) Resp:  [20] 20 (01/02 1041) BP: (151)/(67) 151/67 (01/02 1041) SpO2:  [98 %] 98 % (01/02 1041)  Labs:   Estimated body mass index is 30.58 kg/m as calculated from the following:   Height as of 03/28/16: 4\' 10"  (1.473 m).   Weight as of 03/28/16: 146 lb 4.8 oz (66.4 kg).   Imaging Review Plain radiographs demonstrate severe  degenerative joint disease of the left hip(s). The bone quality appears to be good for age and reported activity level.  She has severe left hip dysplasia.  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 The treatment options including medical management, injection therapy, arthroscopy and arthroplasty were discussed at length. The risks and benefits of total hip arthroplasty were presented and reviewed. The risks due to aseptic loosening, infection, stiffness, dislocation/subluxation,  thromboembolic complications and other imponderables were discussed.  The patient acknowledged the explanation, agreed to proceed with the plan and consent was signed. Patient is being admitted for inpatient treatment for surgery, pain  control, PT, OT, prophylactic antibiotics, VTE prophylaxis, progressive ambulation and ADL's and discharge planning.The patient is planning to be discharged home with home health services

## 2016-04-08 NOTE — Anesthesia Postprocedure Evaluation (Signed)
Anesthesia Post Note  Patient: Cindy McmurrayMargaret H Stapel  Procedure(s) Performed: Procedure(s) (LRB): LEFT TOTAL HIP ARTHROPLASTY ANTERIOR APPROACH (Left)  Patient location during evaluation: PACU Anesthesia Type: General Level of consciousness: awake and alert and patient cooperative Pain management: pain level controlled Vital Signs Assessment: post-procedure vital signs reviewed and stable Respiratory status: spontaneous breathing and respiratory function stable Cardiovascular status: stable Anesthetic complications: no       Last Vitals:  Vitals:   04/08/16 1515 04/08/16 1530  BP: 126/67 140/83  Pulse:    Resp: 16   Temp:      Last Pain:  Vitals:   04/08/16 1515  TempSrc:   PainSc: Asleep                 Dreydon Cardenas S

## 2016-04-08 NOTE — Anesthesia Preprocedure Evaluation (Signed)
Anesthesia Evaluation  Patient identified by MRN, date of birth, ID band Patient awake    Reviewed: Allergy & Precautions, NPO status , Patient's Chart, lab work & pertinent test results  Airway Mallampati: I  TM Distance: >3 FB Neck ROM: Full    Dental   Pulmonary    Pulmonary exam normal        Cardiovascular hypertension, Pt. on medications Normal cardiovascular exam     Neuro/Psych    GI/Hepatic   Endo/Other    Renal/GU      Musculoskeletal   Abdominal   Peds  Hematology   Anesthesia Other Findings   Reproductive/Obstetrics                             Anesthesia Physical Anesthesia Plan  ASA: II  Anesthesia Plan: Spinal and MAC   Post-op Pain Management:    Induction: Intravenous  Airway Management Planned: Simple Face Mask  Additional Equipment:   Intra-op Plan:   Post-operative Plan:   Informed Consent: I have reviewed the patients History and Physical, chart, labs and discussed the procedure including the risks, benefits and alternatives for the proposed anesthesia with the patient or authorized representative who has indicated his/her understanding and acceptance.     Plan Discussed with: CRNA and Surgeon  Anesthesia Plan Comments:         Anesthesia Quick Evaluation  

## 2016-04-09 ENCOUNTER — Encounter (HOSPITAL_COMMUNITY): Payer: Self-pay | Admitting: Orthopaedic Surgery

## 2016-04-09 LAB — CBC
HCT: 29.2 % — ABNORMAL LOW (ref 36.0–46.0)
Hemoglobin: 10.1 g/dL — ABNORMAL LOW (ref 12.0–15.0)
MCH: 30.8 pg (ref 26.0–34.0)
MCHC: 34.6 g/dL (ref 30.0–36.0)
MCV: 89 fL (ref 78.0–100.0)
Platelets: 198 10*3/uL (ref 150–400)
RBC: 3.28 MIL/uL — ABNORMAL LOW (ref 3.87–5.11)
RDW: 12.8 % (ref 11.5–15.5)
WBC: 7.9 10*3/uL (ref 4.0–10.5)

## 2016-04-09 LAB — BASIC METABOLIC PANEL
ANION GAP: 9 (ref 5–15)
BUN: 14 mg/dL (ref 6–20)
CALCIUM: 8.8 mg/dL — AB (ref 8.9–10.3)
CO2: 27 mmol/L (ref 22–32)
Chloride: 98 mmol/L — ABNORMAL LOW (ref 101–111)
Creatinine, Ser: 0.86 mg/dL (ref 0.44–1.00)
GFR calc Af Amer: >60 mL/min (ref >60)
GFR calc non Af Amer: >60 mL/min (ref >60)
Glucose, Bld: 101 mg/dL — ABNORMAL HIGH (ref 65–99)
POTASSIUM: 3.5 mmol/L (ref 3.5–5.1)
Sodium: 134 mmol/L — ABNORMAL LOW (ref 135–145)

## 2016-04-09 MED ORDER — INFLUENZA VAC SPLIT QUAD 0.5 ML IM SUSY
0.5000 mL | PREFILLED_SYRINGE | INTRAMUSCULAR | Status: AC
Start: 2016-04-10 — End: 2016-04-10
  Administered 2016-04-10: 0.5 mL via INTRAMUSCULAR
  Filled 2016-04-09: qty 0.5

## 2016-04-09 NOTE — Progress Notes (Signed)
Subjective: 1 Day Post-Op Procedure(s) (LRB): LEFT TOTAL HIP ARTHROPLASTY ANTERIOR APPROACH (Left) Patient reports pain as moderate.  Labs not back.  Vitals stable.  Objective: Vital signs in last 24 hours: Temp:  [97 F (36.1 C)-98.3 F (36.8 C)] 97 F (36.1 C) (01/03 0600) Pulse Rate:  [87-108] 87 (01/03 0600) Resp:  [16-20] 16 (01/03 0600) BP: (126-151)/(58-84) 128/60 (01/03 0600) SpO2:  [98 %-100 %] 99 % (01/03 0600)  Intake/Output from previous day: 01/02 0701 - 01/03 0700 In: 2200 [I.V.:2200] Out: 2025 [Urine:1675; Blood:350] Intake/Output this shift: No intake/output data recorded.  No results for input(s): HGB in the last 72 hours. No results for input(s): WBC, RBC, HCT, PLT in the last 72 hours. No results for input(s): NA, K, CL, CO2, BUN, CREATININE, GLUCOSE, CALCIUM in the last 72 hours. No results for input(s): LABPT, INR in the last 72 hours.  Sensation intact distally Intact pulses distally Dorsiflexion/Plantar flexion intact Incision: scant drainage  Assessment/Plan: 1 Day Post-Op Procedure(s) (LRB): LEFT TOTAL HIP ARTHROPLASTY ANTERIOR APPROACH (Left) Up with therapy - WBAT left hip; NO HIP ABDUCTION  Cindy HitchChristopher Y Leon George 04/09/2016, 7:31 AM

## 2016-04-09 NOTE — Op Note (Signed)
NAMEAIZLYN, SCHIFANO            ACCOUNT NO.:  [id number]  MEDICAL RECORD NO.:  [id number]  LOCATION:                                 FACILITY:  PHYSICIAN:  Vanita Panda. 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  PROCEDURE:  Left total hip arthroplasty through direct anterior approach.  IMPLANTS:  DePuy Sector Gription acetabular component size 52 with 3 screws, size 36+ 0 polyethylene liner, size 12 Corail femoral component with varus offset, size 36+ 5 ceramic hip ball.  SURGEON:  Vanita Panda. Magnus Ivan, M.D.  ANESTHESIA: 1. Attempted spinal. 2. General.  ANTIBIOTICS:  2 g of IV Ancef.  BLOOD LOSS:  250 mL.  COMPLICATIONS:  None.  INDICATIONS:  Ms. Rozanna Box is a 66 year old female with a Trendelenburg gait secondary to left hip osteoarthritis as well as dysplastic left hip.  Left leg is considerably shorter as well.  At this point, she has tried and failed all forms of conservative treatment.  She wishes for total hip arthroplasty through direct anterior approach.  She understands this may be quite difficult given her deformity.  The risks and benefits of the surgery were explained to her in detail including the risk of acute blood loss anemia, nerve and vessel injury, especially fracture, dislocation, and DVT.  She understands our goals are decreased pain, improved mobility, and overall improved quality of life.  PROCEDURE DESCRIPTION:  After informed consent was obtained, appropriate left hip was marked.  She was brought to the operating room and sat up on the stretcher.  They attempted spinal anesthesia, but due to the severity of her scoliosis could not do so, so they laid  her supine on the stretcher and general anesthesia was obtained.  A Foley catheter was placed.  Then, both feet had traction boots applied to them noting that her left leg was considerably shorter than right.  She was then placed supine on the HANA fracture table with the perineal post in place and both legs in inline skeletal traction devices with no traction applied. Her left operative hip was then prepped and draped with DuraPrep and sterile drapes.  Time-out was called, she was identified as correct patient, correct left hip.  We then made an incision just inferior and posterior to the anterior superior iliac spine and carried this obliquely down the leg.  We dissected down to the tensor fascia lata muscle.  The tensor fascia was then divided longitudinally to proceed with a direct anterior approach to the hip.  Again with her skewed anatomy, it was difficult somewhat to get to her hip, but we got to it somewhat easily after working out our dissection.  We opened up the hip capsule and found significantly deformed and subluxed hip.  We were able to easily make our femoral neck cut proximal to the lesser trochanter, which was basically almost at the level of the lesser trochanter.  I removed the femoral head in its entirety and found it  to be completely flat and oval from a dysplastic hip.  We then cleaned the acetabulum remnants of the acetabular labrum and other debris.  I placed a bent Hohmann over the medial acetabular rim and then began reaming from a size 42 reamer in sequential increments up to a size 52 with all reamers under direct visualization and the last 3 reamers under direct fluoroscopy, so I could obtain my depth of reaming, our inclination, and anteversion as well as get an idea of where to put our new hip center rotation.  I then placed the real DePuy Sector Gription acetabular component size 52, and 3 screws.  I placed a 36+ 0 liner for the acetabular component.  We  then turned our attention to the femur with the leg externally rotated to 120 degrees, extended and adducted, we were able to place a Mueller retractor medially and a Hohmann retractor behind the greater trochanter.  We released the lateral joint capsule, used a box cutting osteotome to enter the femoral canal and a rongeur to lateralize.  We were able to broach from a size 8 broach using the Corail broaching system up to a size 12.  With the 12 in place, we trialed a varus offset femoral neck and a 36+ 1.5 hip ball.  We rolled the leg back up and over with traction and internal rotation reducing the pelvis.  I did feel the tip of the greater trochanter was broken, but did not destabilize the hip.  I was able to then dislocate the hip. Once we assessed it under fluoroscopy and found it was stable, we felt like we needed just a little bit more offset and leg length.  We dislocated the hip and removed the trial components.  We were able to place the real Corail femoral component with varus offset size 12 and the real 36+ 5 ceramic hip ball and reduced this in the acetabulum. Again, we were pleased with leg length, offset, and stability.  We then irrigated the soft tissue with normal saline solution using pulsatile lavage.  We closed whatever we could at the joint capsule with interrupted #1 Ethibond suture followed by #1 Vicryl in the tensor fascia, 0-Vicryl in deep tissue, 2-0 Vicryl in the subcutaneous tissue, interrupted staples on the skin.  Xeroform and Aquacel dressing were applied.  She was taken off the HANA table, taken to the recovery room awake and extubated in stable condition.  Of note, Richardean CanalGilbert Clark, PA-C assisted in the entire case.  His assistance was crucial for facilitating all aspects of this case.     Vanita Pandahristopher Y. Magnus IvanBlackman, M.D.   ______________________________ Vanita Pandahristopher Y. Magnus IvanBlackman, M.D.    CYB/MEDQ  D:  04/08/2016  T:  04/08/2016  Job:  [id number]

## 2016-04-09 NOTE — Evaluation (Signed)
Physical Therapy Evaluation Patient Details Name: Cindy George MRN: [id number] DOB: October 15, 1950 Today's Date: 04/09/2016   History of Present Illness  Admitted with L hip dysplasia, now s/p L THA, Anterior Approach, NO L HIP ABDUCTION;  has a pertinent past medical history of Anemia; Arthritis; Complication of anesthesia;  and Urinary urgency. Pertinent past surgical history of TKA   Clinical Impression   Pt is s/p THA resulting in the deficits listed below (see PT Problem List). Needs reinforcement of no hip abduction; Overall walking quite well; has a 4wheeled RW at home, but I'm recommending a youth-sized RW for greater stability;  Pt will benefit from skilled PT to increase their independence and safety with mobility to allow discharge to the venue listed below.      Follow Up Recommendations Home health PT;Supervision/Assistance - 24 hour    Equipment Recommendations  Rolling walker with 5" wheels;3in1 (PT) (Youth-sized)    Recommendations for Other Services       Precautions / Restrictions Precautions Precautions: Other (comment) (No L hip ABduction) Precaution Comments: No L hip abduction Restrictions Weight Bearing Restrictions: No LLE Weight Bearing: Weight bearing as tolerated      Mobility  Bed Mobility              Transfers Overall transfer level: Needs assistance Equipment used: Rolling walker (2 wheeled) Transfers: Sit to/from Stand Sit to Stand: Min guard         General transfer comment: Cues for hand placement and safety  Ambulation/Gait Ambulation/Gait assistance: Min guard (with and without physical contact) Ambulation Distance (Feet): 90 Feet Assistive device: Rolling walker (2 wheeled) Gait Pattern/deviations: Step-through pattern     General Gait Details: Cues for gait sequence and RW proximity  Stairs            Wheelchair Mobility    Modified Rankin (Stroke Patients Only)       Balance Overall balance  assessment: Needs assistance Sitting-balance support: No upper extremity supported;Feet supported Sitting balance-Leahy Scale: Fair Sitting balance - Comments: Able to sit without back support at EOB   Standing balance support: Bilateral upper extremity supported;No upper extremity supported;During functional activity Standing balance-Leahy Scale: Fair Standing balance comment: Able to stand statically at sink for grooming tasks without UE support and with supervision for safety.                             Pertinent Vitals/Pain Pain Assessment: Faces Faces Pain Scale: Hurts even more Pain Location: L hip Pain Descriptors / Indicators: Sore;Operative site guarding Pain Intervention(s): Monitored during session    Home Living Family/patient expects to be discharged to:: Private residence Living Arrangements: Alone Available Help at Discharge: Family;Available 24 hours/day Type of Home: House Home Access: Stairs to enter Entrance Stairs-Rails: Doctor, general practice of Steps: 1+1 Home Layout: Able to live on main level with bedroom/bathroom;Two level Home Equipment: Cane - single point;Shower seat;Bedside commode;Walker - 4 wheels      Prior Function Level of Independence: Independent with assistive device(s)         Comments: Using a cane for functional mobility.     Hand Dominance   Dominant Hand: Left    Extremity/Trunk Assessment   Upper Extremity Assessment Upper Extremity Assessment: Defer to OT evaluation    Lower Extremity Assessment Lower Extremity Assessment: LLE deficits/detail LLE Deficits / Details: Grossly decr AROM and strength, limited by pain postop       Communication  Communication: No difficulties  Cognition Arousal/Alertness: Awake/alert Behavior During Therapy: WFL for tasks assessed/performed Overall Cognitive Status: Within Functional Limits for tasks assessed                      General Comments       Exercises Total Joint Exercises Quad Sets: AROM;Left;5 reps Gluteal Sets: AROM;Both;5 reps Towel Squeeze: AROM;Both;5 reps Heel Slides: AAROM;Left;5 reps   Assessment/Plan    PT Assessment Patient needs continued PT services  PT Problem List Decreased strength;Decreased range of motion;Decreased activity tolerance;Decreased balance;Decreased mobility;Decreased coordination;Decreased knowledge of use of DME;Decreased knowledge of precautions;Pain          PT Treatment Interventions DME instruction;Gait training;Stair training;Functional mobility training;Therapeutic activities;Therapeutic exercise;Patient/family education;Balance training    PT Goals (Current goals can be found in the Care Plan section)  Acute Rehab PT Goals Patient Stated Goal: walk without pain PT Goal Formulation: With patient Time For Goal Achievement: 04/16/16 Potential to Achieve Goals: Good    Frequency 7X/week   Barriers to discharge        Co-evaluation               End of Session   Activity Tolerance: Patient tolerated treatment well Patient left: in chair;with call bell/phone within reach Nurse Communication: Mobility status         Time: 4540-98111035-1113 PT Time Calculation (min) (ACUTE ONLY): 38 min   Charges:   PT Evaluation $PT Eval Moderate Complexity: 1 Procedure PT Treatments $Gait Training: 8-22 mins $Therapeutic Activity: 8-22 mins   PT G Codes:        Levi AlandHolly H Demarion Pondexter 04/09/2016, 12:17 PM  Van ClinesHolly Allyssa Abruzzese, PT  Acute Rehabilitation Services Pager [phone] Office [phone]

## 2016-04-09 NOTE — Progress Notes (Signed)
Physical Therapy Treatment Patient Details Name: Cindy MunsonMargaret H Monds MRN: [id number] DOB: 1950/07/19 Today's Date: 04/09/2016    History of Present Illness Admitted with L hip dysplasia, now s/p L THA, Anterior Approach, NO L HIP ABDUCTION;  has a pertinent past medical history of Anemia; Arthritis; Complication of anesthesia;  and Urinary urgency. Pertinent past surgical history of TKA     PT Comments    Session focused on therex and hip control; Able to verbalize no hip abduction correctly and apply that to mobility, bed mobility in particular; On track for dc home tomorrow; will need stair training  Follow Up Recommendations  Home health PT;Supervision/Assistance - 24 hour     Equipment Recommendations  Rolling walker with 5" wheels;3in1 (PT) (Youth-sized)    Recommendations for Other Services       Precautions / Restrictions Precautions Precautions: Other (comment) (No L hip ABduction) Precaution Comments: No L hip abduction Restrictions LLE Weight Bearing: Weight bearing as tolerated    Mobility  Bed Mobility Overal bed mobility: Needs Assistance Bed Mobility: Rolling;Sidelying to Sit Rolling: Min assist Sidelying to sit: Min guard       General bed mobility comments: Employed rolling and sidelie to sit technique this session to give pt options for bed mobility ; rolled to non-operative R side; grimace with moving  Transfers Overall transfer level: Needs assistance Equipment used: Rolling walker (2 wheeled) Transfers: Sit to/from Stand Sit to Stand: Min guard         General transfer comment: Cues for hand placement and safety  Ambulation/Gait Ambulation/Gait assistance: Min guard (with and without physical contact) Ambulation Distance (Feet): 30 Feet Assistive device: Rolling walker (2 wheeled) Gait Pattern/deviations: Step-through pattern     General Gait Details: Cues for gait sequence and RW proximity   Stairs            Wheelchair  Mobility    Modified Rankin (Stroke Patients Only)       Balance     Sitting balance-Leahy Scale: Fair       Standing balance-Leahy Scale: Fair                      Cognition Arousal/Alertness: Awake/alert Behavior During Therapy: WFL for tasks assessed/performed Overall Cognitive Status: Within Functional Limits for tasks assessed                      Exercises Total Joint Exercises Ankle Circles/Pumps: AROM;Both;20 reps Quad Sets: AROM;Left;10 reps Gluteal Sets: AROM;Both;10 reps Towel Squeeze: AROM;Both;10 reps Heel Slides: AAROM;Left;10 reps    General Comments        Pertinent Vitals/Pain Pain Assessment: Faces Faces Pain Scale: Hurts even more Pain Location: L hip Pain Descriptors / Indicators: Sore;Operative site guarding Pain Intervention(s): Monitored during session;Patient requesting pain meds-RN notified    Home Living                      Prior Function            PT Goals (current goals can now be found in the care plan section) Acute Rehab PT Goals Patient Stated Goal: walk without pain PT Goal Formulation: With patient Time For Goal Achievement: 04/16/16 Potential to Achieve Goals: Good Progress towards PT goals: Progressing toward goals    Frequency    7X/week      PT Plan Current plan remains appropriate    Co-evaluation  End of Session Equipment Utilized During Treatment: Gait belt Activity Tolerance: Patient tolerated treatment well Patient left: in chair;with call bell/phone within reach     Time: 1542-1609 PT Time Calculation (min) (ACUTE ONLY): 27 min  Charges:  $Gait Training: 8-22 mins $Therapeutic Exercise: 8-22 mins                    G Codes:      Levi Aland 2016-04-21, 4:50 PM  Van Clines, PT  Acute Rehabilitation Services Pager [phone] Office [phone]

## 2016-04-09 NOTE — Evaluation (Signed)
Occupational Therapy Evaluation Patient Details Name: Cindy George MRN: [id number] DOB: 1950/12/25 Today's Date: 04/09/2016    History of Present Illness Admitted with L hip dysplasia, now s/p L THA, Anterior Approach, NO L HIP ABDUCTION;  has a pertinent past medical history of Anemia; Arthritis; Complication of anesthesia;  and Urinary urgency. Pertinent past surgical history of TKA    Clinical Impression   PTA, pt was independent with ADL and functional mobility. Pt currently requires mod assist with LB ADL and min guard assist for functional mobility. Pt lives alone but plans to D/C home with 24 hour assist from daughter for a few weeks. She was very active prior to this surgery and is motivated to return to [facility] and improve independence. Pt would benefit from continued OT services while admitted to improve independence with ADL and functional mobility in order to maximize return to PLOF and safe D/C home. No OT follow-up post-acute D/C recommended. However, OT will continue to follow acutely with focus on shower transfers and LB ADL.     Follow Up Recommendations  No OT follow up;Supervision/Assistance - 24 hour    Equipment Recommendations  None recommended by OT (May need RW vs rollator she has at home, will defer to PT)    Recommendations for Other Services       Precautions / Restrictions Precautions Precautions: Other (comment) (No L hip ABduction) Restrictions Weight Bearing Restrictions: Yes LLE Weight Bearing: Weight bearing as tolerated      Mobility Bed Mobility Overal bed mobility: Needs Assistance Bed Mobility: Supine to Sit     Supine to sit: Min assist     General bed mobility comments: Min assist to move L LE. Pt is very independent at baseline and determined to perform tasks for herself.  Transfers Overall transfer level: Needs assistance Equipment used: Rolling walker (2 wheeled) Transfers: Sit to/from Stand Sit to Stand: Min guard               Balance Overall balance assessment: Needs assistance Sitting-balance support: No upper extremity supported;Feet supported Sitting balance-Leahy Scale: Fair Sitting balance - Comments: Able to sit without back support at EOB   Standing balance support: Bilateral upper extremity supported;No upper extremity supported;During functional activity Standing balance-Leahy Scale: Fair Standing balance comment: Able to stand statically at sink for grooming tasks without UE support and with supervision for safety.                            ADL Overall ADL's : Needs assistance/impaired     Grooming: Supervision/safety;Standing   Upper Body Bathing: Set up;Sitting   Lower Body Bathing: Moderate assistance;Sit to/from stand   Upper Body Dressing : Set up;Sitting   Lower Body Dressing: Moderate assistance;Sit to/from stand   Toilet Transfer: Min guard;Ambulation;RW;BSC   Toileting- Clothing Manipulation and Hygiene: Sitting/lateral lean;Supervision/safety       Functional mobility during ADLs: Min guard;Rolling walker General ADL Comments: Pt able to tolerate standing at sink without UE support for grooming tasks with supervision and complete toilet transfer with ambulation to bathroom with min guard assist. Pt and family educated on fall prevention and compensatory ADL strategies.     Vision Vision Assessment?: No apparent visual deficits   Perception     Praxis      Pertinent Vitals/Pain Pain Assessment: Faces Faces Pain Scale: Hurts even more Pain Location: L hip Pain Descriptors / Indicators: Sore;Operative site guarding Pain Intervention(s): Limited activity within  patient's tolerance;Monitored during session;Repositioned;Ice applied     Hand Dominance Left   Extremity/Trunk Assessment Upper Extremity Assessment Upper Extremity Assessment: Overall WFL for tasks assessed   Lower Extremity Assessment Lower Extremity Assessment: Defer to PT  evaluation       Communication Communication Communication: No difficulties   Cognition Arousal/Alertness: Awake/alert Behavior During Therapy: WFL for tasks assessed/performed Overall Cognitive Status: Within Functional Limits for tasks assessed                     General Comments       Exercises       Shoulder Instructions      Home Living Family/patient expects to be discharged to:: Private residence Living Arrangements: Alone Available Help at Discharge: Family;Available 24 hours/day Type of Home: House Home Access: Stairs to enter Enter[employer] CorporationEntrance Stairs-Number of Steps: 1+1 Entrance Stairs-Rails: Right;Left Home Layout: Able to live on main level with bedroom/bathroom;Two level     Bathroom Shower/Tub: Producer, television/film/videoWalk-in shower   Bathroom Toilet: Handicapped height     Home Equipment: Cane - single point;Shower seat;Bedside commode;Walker - 4 wheels          Prior Functioning/Environment Level of Independence: Independent with assistive device(s)        Comments: Using a cane for functional mobility.        OT Problem List: Decreased strength;Decreased range of motion;Decreased activity tolerance;Impaired balance (sitting and/or standing);Decreased safety awareness;Decreased knowledge of use of DME or AE;Decreased knowledge of precautions;Pain   OT Treatment/Interventions: Self-care/ADL training;Therapeutic exercise;DME and/or AE instruction;Therapeutic activities;Patient/family education;Balance training    OT Goals(Current goals can be found in the care plan section) Acute Rehab OT Goals Patient Stated Goal: to be able to go to the bathroom by herself OT Goal Formulation: With patient/family Time For Goal Achievement: 04/16/16 Potential to Achieve Goals: Good ADL Goals Pt Will Perform Lower Body Bathing: with modified independence;sit to/from stand;with adaptive equipment Pt Will Perform Lower Body Dressing: with modified independence;with adaptive  equipment;sit to/from stand Pt Will Transfer to Toilet: with modified independence;ambulating;bedside commode (BSC over toilet) Pt Will Perform Toileting - Clothing Manipulation and hygiene: with modified independence;sit to/from stand Pt Will Perform Tub/Shower Transfer: with modified independence;Shower transfer;ambulating;3 in 1;rolling walker  OT Frequency: Min 2X/week   Barriers to D/C:            Co-evaluation              End of Session Equipment Utilized During Treatment: Gait belt;Rolling walker  Activity Tolerance: Patient tolerated treatment well Patient left: in chair;with call bell/phone within reach;with family/visitor present   Time: 5409-81190955-1025 OT Time Calculation (min): 30 min Charges:  OT General Charges $OT Visit: 1 Procedure OT Evaluation $OT Eval Moderate Complexity: 1 Procedure OT Treatments $Self Care/Home Management : 8-22 mins  Doristine SectionCharity A Radford Pease, OTR/L [phone] 04/09/2016, 10:52 AM

## 2016-04-10 LAB — CBC
HEMATOCRIT: 26.7 % — AB (ref 36.0–46.0)
Hemoglobin: 9.1 g/dL — ABNORMAL LOW (ref 12.0–15.0)
MCH: 30.4 pg (ref 26.0–34.0)
MCHC: 34.1 g/dL (ref 30.0–36.0)
MCV: 89.3 fL (ref 78.0–100.0)
PLATELETS: 187 10*3/uL (ref 150–400)
RBC: 2.99 MIL/uL — ABNORMAL LOW (ref 3.87–5.11)
RDW: 12.8 % (ref 11.5–15.5)
WBC: 8.3 10*3/uL (ref 4.0–10.5)

## 2016-04-10 MED ORDER — ASPIRIN 81 MG PO CHEW: 81.0000 mg | CHEWABLE_TABLET | Freq: Two times a day (BID) | ORAL | 0 refills | Status: DC

## 2016-04-10 MED ORDER — OXYCODONE-ACETAMINOPHEN 5-325 MG PO TABS: 1.0000 | ORAL_TABLET | ORAL | 0 refills | Status: DC | PRN

## 2016-04-10 MED ORDER — METHOCARBAMOL 500 MG PO TABS: 500.0000 mg | ORAL_TABLET | Freq: Four times a day (QID) | ORAL | 0 refills | Status: DC | PRN

## 2016-04-10 NOTE — Progress Notes (Signed)
Occupational Therapy Treatment Patient Details Name: Cindy George MRN: [id number] DOB: [birth year] Today's Date: 04/10/2016    History of present illness Admitted with L hip dysplasia, now s/p L THA, Anterior Approach, NO L HIP ABDUCTION;  has a pertinent past medical history of Anemia; Arthritis; Complication of anesthesia;  and Urinary urgency. Pertinent past surgical history of TKA    OT comments  Pt progressing towards OT goals, this session focused on energy conservation, fall prevention, and ADL. Pt at adequate level for dc to venue below and has good family support. Pt and family with no further questions for OT. Please see performance level below.   Follow Up Recommendations  No OT follow up;Supervision/Assistance - 24 hour    Equipment Recommendations  None recommended by OT    Recommendations for Other Services      Precautions / Restrictions Precautions Precautions: Other (comment) Precaution Comments: No L hip abduction Restrictions Weight Bearing Restrictions: Yes LLE Weight Bearing: Weight bearing as tolerated       Mobility Bed Mobility Overal bed mobility: Needs Assistance Bed Mobility: Sit to Supine     Supine to sit: Min guard Sit to supine: Min guard (Pt used leg lifter to assist with LLE)   General bed mobility comments: Pt able to perform bed scoot as well with sequencing from OT  Transfers Overall transfer level: Needs assistance Equipment used: Rolling walker (2 wheeled) Transfers: Sit to/from Stand Sit to Stand: Min guard         General transfer comment: Cues for hand placement and safety    Balance Overall balance assessment: Needs assistance Sitting-balance support: No upper extremity supported;Feet supported Sitting balance-Leahy Scale: Good Sitting balance - Comments: Able to sit without back support at EOB   Standing balance support: During functional activity;No upper extremity supported Standing balance-Leahy Scale:  Fair Standing balance comment: able to stand at sink statically briefly to wash hands                   ADL Overall ADL's : Needs assistance/impaired     Grooming: Wash/dry hands;Supervision/safety;Standing Grooming Details (indicate cue type and reason): sink level         Upper Body Dressing : Set up;Sitting Upper Body Dressing Details (indicate cue type and reason): bath robe     Toilet Transfer: Min guard;Ambulation;RW;BSC Toilet Transfer Details (indicate cue type and reason): BSC over toilet in bathroom Toileting- Clothing Manipulation and Hygiene: Sitting/lateral lean;Supervision/safety Toileting - Clothing Manipulation Details (indicate cue type and reason): Pt able to perform peri care and maintain no ABduction     Functional mobility during ADLs: Min guard;Rolling walker General ADL Comments: Pt and family provided with handout for fall prevention and energy conservation, as well as AE options. Pt states that she has a sock donner and grabber. Pt's daughter plans on purchasing leg lifter from [employer] gift shop tomorrow      Vision                     Perception     Praxis      Cognition   Behavior During Therapy: [facility] for tasks assessed/performed Overall Cognitive Status: Within Functional Limits for tasks assessed                       Extremity/Trunk Assessment               Exercises     Shoulder Instructions  General Comments      Pertinent Vitals/ Pain       Pain Assessment: 0-10 Pain Score: 6  Pain Location: L hip Pain Descriptors / Indicators: Sore;Operative site guarding Pain Intervention(s): Monitored during session;Repositioned;Ice applied  Home Living                                          Prior Functioning/Environment              Frequency  Min 2X/week        Progress Toward Goals  OT Goals(current goals can now be found in the care plan section)  Progress towards OT  goals: Progressing toward goals  Acute Rehab OT Goals Patient Stated Goal: walk without pain OT Goal Formulation: With patient/family Time For Goal Achievement: 04/16/16 Potential to Achieve Goals: Good  Plan Discharge plan remains appropriate    Co-evaluation                 End of Session Equipment Utilized During Treatment: Gait belt;Rolling walker   Activity Tolerance Patient tolerated treatment well   Patient Left in bed;with call bell/phone within reach;with family/visitor present   Nurse Communication Mobility status        Time: 1700-1723 OT Time Calculation (min): 23 min  Charges: OT General Charges $OT Visit: 1 Procedure OT Treatments $Self Care/Home Management : 23-37 mins  Emelda FearLaura J Almendra Loria 04/10/2016, 6:28 PM  Sherryl MangesLaura Coralee Edberg OTR/L [phone]

## 2016-04-10 NOTE — Progress Notes (Signed)
Physical Therapy Treatment Patient Details Name: Cindy George MRN: [id number] DOB: Jun 21, 1950 Today's Date: 04/10/2016    History of Present Illness Admitted with L hip dysplasia, now s/p L THA, Anterior Approach, NO L HIP ABDUCTION;  has a pertinent past medical history of Anemia; Arthritis; Complication of anesthesia;  and Urinary urgency. Pertinent past surgical history of TKA     PT Comments    Pt presents with improved ability to perform bed mobs this session without assistance. Improved gait distance but continues to have increased c/o discomfort in anterior hip with gait. Pt will need to perform stairs before DC tomorrow.    Follow Up Recommendations  Home health PT;Supervision/Assistance - 24 hour     Equipment Recommendations  Rolling walker with 5" wheels;3in1 (PT);Other (comment)    Recommendations for Other Services       Precautions / Restrictions Precautions Precautions: Other (comment) Precaution Comments: No L hip abduction Restrictions Weight Bearing Restrictions: Yes LLE Weight Bearing: Weight bearing as tolerated    Mobility  Bed Mobility Overal bed mobility: Needs Assistance Bed Mobility: Supine to Sit     Supine to sit: Min guard     General bed mobility comments: Min guard requring increased time to bring le's EOB  Transfers Overall transfer level: Needs assistance Equipment used: Rolling walker (2 wheeled) Transfers: Sit to/from Stand Sit to Stand: Min guard         General transfer comment: Cues for hand placement and safety  Ambulation/Gait Ambulation/Gait assistance: Min guard Ambulation Distance (Feet): 180 Feet Assistive device: Rolling walker (2 wheeled) Gait Pattern/deviations: Step-through pattern Gait velocity: decreased Gait velocity interpretation: Below normal speed for age/gender General Gait Details: Cues for gait sequence and RW proximity   Stairs            Wheelchair Mobility    Modified Rankin  (Stroke Patients Only)       Balance                                    Cognition Arousal/Alertness: Awake/alert Behavior During Therapy: WFL for tasks assessed/performed Overall Cognitive Status: Within Functional Limits for tasks assessed                      Exercises      General Comments        Pertinent Vitals/Pain Pain Assessment: 0-10 Pain Score: 6  Pain Location: L hip Pain Descriptors / Indicators: Sore;Operative site guarding Pain Intervention(s): Monitored during session;Premedicated before session;Limited activity within patient's tolerance;Ice applied    Home Living                      Prior Function            PT Goals (current goals can now be found in the care plan section) Progress towards PT goals: Progressing toward goals    Frequency    7X/week      PT Plan Current plan remains appropriate    Co-evaluation             End of Session Equipment Utilized During Treatment: Gait belt Activity Tolerance: Patient tolerated treatment well Patient left: Other (comment) (left with OT who was taking pt to bathroom)     Time: [id number] PT Time Calculation (min) (ACUTE ONLY): 16 min  Charges:  $Gait Training: 8-22 mins  G Codes:      Colin Broach PT, DPT  [phone]  04/10/2016, 4:58 PM

## 2016-04-10 NOTE — Progress Notes (Signed)
Physical Therapy Treatment Patient Details Name: Cindy George MRN: [id number] DOB: December 24, 1950 Today's Date: 04/10/2016    History of Present Illness Admitted with L hip dysplasia, now s/p L THA, Anterior Approach, NO L HIP ABDUCTION;  has a pertinent past medical history of Anemia; Arthritis; Complication of anesthesia;  and Urinary urgency. Pertinent past surgical history of TKA     PT Comments    Pt is POD 2 and moving well with therapy. Pt has had lower Bp, but this does not limit her ability to participate this session. Performed LE strengthening exercises and advanced gait distance this session. Pt will needs to work on stairs prior to discharge.    Follow Up Recommendations  Home health PT;Supervision/Assistance - 24 hour     Equipment Recommendations  Rolling walker with 5" wheels;3in1 (PT);Other (comment) (youth size)    Recommendations for Other Services       Precautions / Restrictions Precautions Precautions: Other (comment) Precaution Comments: No L hip abduction Restrictions Weight Bearing Restrictions: Yes LLE Weight Bearing: Weight bearing as tolerated    Mobility  Bed Mobility Overal bed mobility: Needs Assistance Bed Mobility: Sit to Supine       Sit to supine: Min assist   General bed mobility comments: Min A to assist with bringing LE's into bed  Transfers Overall transfer level: Needs assistance Equipment used: Rolling walker (2 wheeled) Transfers: Sit to/from Stand Sit to Stand: Min guard         General transfer comment: Cues for hand placement and safety  Ambulation/Gait Ambulation/Gait assistance: Min guard Ambulation Distance (Feet): 180 Feet Assistive device: Rolling walker (2 wheeled) Gait Pattern/deviations: Step-through pattern Gait velocity: decreased Gait velocity interpretation: Below normal speed for age/gender General Gait Details: Cues for gait sequence and RW proximity   Stairs            Wheelchair  Mobility    Modified Rankin (Stroke Patients Only)       Balance Overall balance assessment: Needs assistance Sitting-balance support: No upper extremity supported;Feet supported Sitting balance-Leahy Scale: Fair Sitting balance - Comments: Able to sit without back support at EOB   Standing balance support: Single extremity supported;During functional activity Standing balance-Leahy Scale: Fair Standing balance comment: able to stand at sink statically briefly to wash hands                    Cognition Arousal/Alertness: Awake/alert Behavior During Therapy: WFL for tasks assessed/performed Overall Cognitive Status: Within Functional Limits for tasks assessed                      Exercises Total Joint Exercises Ankle Circles/Pumps: AROM;Both;20 reps Quad Sets: AROM;Left;10 reps Gluteal Sets: AROM;Both;10 reps Towel Squeeze: AROM;Both;10 reps Heel Slides: AAROM;Left;10 reps    General Comments        Pertinent Vitals/Pain Pain Assessment: 0-10 Pain Score: 4  Pain Location: L hip Pain Descriptors / Indicators: Sore;Operative site guarding Pain Intervention(s): Monitored during session;Premedicated before session;Ice applied    Home Living                      Prior Function            PT Goals (current goals can now be found in the care plan section) Acute Rehab PT Goals Patient Stated Goal: walk without pain Progress towards PT goals: Progressing toward goals    Frequency    7X/week      PT Plan  Current plan remains appropriate    Co-evaluation             End of Session Equipment Utilized During Treatment: Gait belt Activity Tolerance: Patient tolerated treatment well Patient left: in bed;with call bell/phone within reach     Time: [id number] PT Time Calculation (min) (ACUTE ONLY): 39 min  Charges:  $Gait Training: 23-37 mins $Therapeutic Exercise: 8-22 mins                    G Codes:      Colin BroachSabra M. Carmon Sahli PT,  DPT  [phone]  04/10/2016, 11:28 AM

## 2016-04-10 NOTE — Plan of Care (Signed)
Problem: Physical Regulation: Goal: Will remain free from infection Outcome: Progressing Had low grade fever earlier this shift, encouraged to use incentive spirometer, temp. 99.2 at midnight  Problem: Tissue Perfusion: Goal: Risk factors for ineffective tissue perfusion will decrease Outcome: Progressing No s/s of dvt  Problem: Activity: Goal: Risk for activity intolerance will decrease Outcome: Progressing Tolerated activities well  Problem: Bowel/Gastric: Goal: Will not experience complications related to bowel motility Outcome: Progressing No gastric or bowel issues noted  Problem: Activity: Goal: Will remain free from falls Outcome: Progressing Safety precautions maintained, no fall or injury noted  Problem: Physical Regulation: Goal: Postoperative complications will be avoided or minimized Outcome: Progressing No post op complications noted  Problem: Pain Management: Goal: Pain level will decrease with appropriate interventions Outcome: Progressing Medicated twice for pain with moderate relief

## 2016-04-11 NOTE — Progress Notes (Signed)
Patient ID: Cindy MunsonMargaret H George, female   DOB: [birth year], 66 y.o.   MRN: [id number] Doing well.  Can be discharged to home today.

## 2016-04-11 NOTE — Discharge Summary (Signed)
Patient ID: Cindy George MRN: [id number] DOB/AGE: April 23, 1950 66 y.o.  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 Anemia    takes Ferrous Sulfate daily  . Arthritis   . Complication of anesthesia   . High blood pressure    takes Avalide daily  . High cholesterol    takes Atorvastatin daily  . History of bronchitis 2016  . History of kidney stones   . Hypothyroidism    takes Synthroid daily  . Joint pain   . Pneumonia    at age 67  . Urinary urgency     Surgeries: Procedure(s): LEFT TOTAL HIP ARTHROPLASTY ANTERIOR APPROACH on 04/08/2016   Consultants:   Discharged Condition: Improved  Hospital Course: Cindy George is an 66 y.o. female who was admitted 04/08/2016 for operative treatment ofOsteoarthritis resulting from hip dysplasia on one side, left. Patient has severe unremitting pain that affects sleep, daily activities, and work/hobbies. After pre-op clearance the patient was taken to the operating room on 04/08/2016 and underwent  Procedure(s): LEFT TOTAL HIP ARTHROPLASTY ANTERIOR APPROACH.    Patient was given perioperative antibiotics: Anti-infectives    Start     Dose/Rate Route Frequency Ordered Stop   04/08/16 1830  ceFAZolin (ANCEF) IVPB 1 g/50 mL premix     1 g 100 mL/hr over 30 Minutes Intravenous Every 6 hours 04/08/16 1730 04/09/16 0034   04/08/16 1035  ceFAZolin (ANCEF) 2-4 GM/100ML-% IVPB    Comments:  Cindy George   : cabinet override      04/08/16 1035 04/08/16 2244   04/07/16 0914  ceFAZolin (ANCEF) IVPB 2g/100 mL premix  Status:  Discontinued     2 g 200 mL/hr over 30 Minutes Intravenous On call to O.R. 04/07/16 0914 04/08/16 1730       Patient was given sequential compression devices, early  ambulation, and chemoprophylaxis to prevent DVT.  Patient benefited maximally from hospital stay and there were no complications.    Recent vital signs: Patient Vitals for the past 24 hrs:  BP Temp Temp src Pulse Resp SpO2  04/11/16 0501 (!) 124/57 99.6 F (37.6 C) Oral 97 16 99 %  04/10/16 2200 (!) 110/53 99.6 F (37.6 C) Oral 94 16 99 %  04/10/16 1453 (!) 101/42 99.4 F (37.4 C) - 87 16 99 %  04/10/16 0857 (!) 95/58 97.7 F (36.5 C) Oral - 18 96 %     Recent laboratory studies:  Recent Labs  04/09/16 0903 04/10/16 1025  WBC 7.9 8.3  HGB 10.1* 9.1*  HCT 29.2* 26.7*  PLT 198 187  NA 134*  --   K 3.5  --   CL 98*  --   CO2 27  --   BUN 14  --   CREATININE 0.86  --   GLUCOSE 101*  --   CALCIUM 8.8*  --      Discharge Medications:   Allergies as of 04/11/2016   No Known Allergies     Medication List    STOP taking these medications   HYDROcodone-acetaminophen 7.5-325 MG tablet Commonly known as:  NORCO   traMADol-acetaminophen 37.5-325 MG tablet Commonly known as:  ULTRACET     TAKE these medications   acetaminophen 500 MG tablet Commonly known as:  TYLENOL Take  500 mg by mouth every 6 (six) hours as needed for mild pain.   aspirin EC 81 MG tablet Take 81 mg by mouth every other day. What changed:  Another medication with the same name was added. Make sure you understand how and when to take each.   aspirin 81 MG chewable tablet Chew 1 tablet (81 mg total) by mouth 2 (two) times daily. What changed:  You were already taking a medication with the same name, and this prescription was added. Make sure you understand how and when to take each.   atorvastatin 10 MG tablet Commonly known as:  LIPITOR Take 10 mg by mouth daily.   calcium carbonate 1500 (600 Ca) MG Tabs tablet Commonly known as:  OSCAL Take 600 mg of elemental calcium by mouth 2 (two) times a week.   FISH OIL PO Take 1 capsule by mouth daily.   irbesartan-hydrochlorothiazide 300-12.5 MG  tablet Commonly known as:  AVALIDE Take 1 tablet by mouth daily.   Iron 325 (65 Fe) MG Tabs Take 1 tablet by mouth 2 (two) times a week.   levothyroxine 175 MCG tablet Commonly known as:  SYNTHROID, LEVOTHROID Take 175 mcg by mouth daily.   methocarbamol 500 MG tablet Commonly known as:  ROBAXIN Take 1 tablet (500 mg total) by mouth every 6 (six) hours as needed for muscle spasms.   multivitamin with minerals Tabs tablet Take 1 tablet by mouth daily. With D3 Iron  calcium   oxyCODONE-acetaminophen 5-325 MG tablet Commonly known as:  ROXICET Take 1-2 tablets by mouth every 4 (four) hours as needed.   potassium chloride SA 20 MEQ tablet Commonly known as:  K-DUR,KLOR-CON Take 1 tablet (20 mEq total) by mouth 2 (two) times daily.            Durable Medical Equipment        Start     Ordered   04/08/16 1731  DME Walker rolling  Once    Question:  Patient needs a walker to treat with the following condition  Answer:  Status post left hip replacement   04/08/16 1730   04/08/16 1731  DME 3 n 1  Once     04/08/16 1730      Diagnostic Studies: Dg Pelvis Portable  Result Date: 04/08/2016 CLINICAL DATA:  Left hip replacement.  Osteoarthritis. EXAM: PORTABLE PELVIS 1-2 VIEWS COMPARISON:  CT scan of the abdomen and pelvis dated 10/25/2009 FINDINGS: The patient has undergone left total hip prosthesis insertion. There is a fracture of the left greater trochanter. The components of the total  hip prosthesis appear in good position. IMPRESSION: Total hip prosthesis appears in good position. Fracture of the base of the left greater trochanter. Electronically Signed   By: Cindy BoyersJames  George M.D.   On: 04/08/2016 15:35   Dg C-arm 61-120 Min  Result Date: 04/08/2016 CLINICAL DATA:  Left hip arthroplasty EXAM: DG C-ARM 61-120 MIN; OPERATIVE LEFT HIP WITH PELVIS COMPARISON:  01/14/2016 radiographs of the left hip FINDINGS: Five C-arm fluoroscopic views during left hip arthroplasty are provided.  Prior to left hip arthroplasty there is marked osteoarthritic joint space narrowing of the left hip with subluxed left femoral head superolaterally. Joint space narrowing is seen of the right hip as well. Subchondral sclerosis and degenerate cystic change noted about the left hip. Intact hardware noted on the AP and frog-leg views post procedure. IMPRESSION: Intact left total hip arthroplasty with uncemented femoral stem. Electronically Signed   By: Rene Kocheravid  Kwon M.D.  On: 04/08/2016 15:29   Dg Hip Operative Unilat W Or W/o Pelvis Left  Result Date: 04/08/2016 CLINICAL DATA:  Left hip arthroplasty EXAM: DG C-ARM 61-120 MIN; OPERATIVE LEFT HIP WITH PELVIS COMPARISON:  01/14/2016 radiographs of the left hip FINDINGS: Five C-arm fluoroscopic views during left hip arthroplasty are provided. Prior to left hip arthroplasty there is marked osteoarthritic joint space narrowing of the left hip with subluxed left femoral head superolaterally. Joint space narrowing is seen of the right hip as well. Subchondral sclerosis and degenerate cystic change noted about the left hip. Intact hardware noted on the AP and frog-leg views post procedure. IMPRESSION: Intact left total hip arthroplasty with uncemented femoral stem. Electronically Signed   By: Tollie Eth M.D.   On: 04/08/2016 15:29    Disposition: 01-Home or Self Care  Discharge Instructions    Discharge patient    Complete by:  As directed    Discharge disposition:  01-Home or Self Care   Discharge patient date:  04/11/2016      Follow-up Information    Kathryne Hitch, MD Follow up in 2 week(s).   Specialty:  Orthopedic Surgery Contact information: [address] [place] [address] [phone]            Signed: Kathryne Hitch 04/11/2016, 7:08 AM

## 2016-04-11 NOTE — Progress Notes (Signed)
Physical Therapy Treatment Patient Details Name: Cindy MunsonMargaret H George MRN: [id number] DOB: 10-Nov-1950 Today's Date: 04/11/2016    History of Present Illness Admitted with L hip dysplasia, now s/p L THA, Anterior Approach, NO L HIP ABDUCTION;  has a pertinent past medical history of Anemia; Arthritis; Complication of anesthesia;  and Urinary urgency. Pertinent past surgical history of TKA     PT Comments    Pt is POD 2 and moving better with therapy. Performed stair negotiation with good adherence to sequencing. Pt still hesitant to weight bear through LLE, but this is improving daily. Pt adherent to restrictions and HEP. Pt will not require an additional therapy visit prior to discharge this date. RN notified.    Follow Up Recommendations  Home health PT;Supervision/Assistance - 24 hour     Equipment Recommendations  Rolling walker with 5" wheels;3in1 (PT);Other (comment)    Recommendations for Other Services       Precautions / Restrictions Precautions Precautions: Other (comment) Precaution Comments: No L hip abduction Restrictions Weight Bearing Restrictions: Yes LLE Weight Bearing: Weight bearing as tolerated    Mobility  Bed Mobility Overal bed mobility: Needs Assistance Bed Mobility: Sit to Supine       Sit to supine: Min assist   General bed mobility comments: Min A to bring LLE into bed.   Transfers Overall transfer level: Needs assistance Equipment used: Rolling walker (2 wheeled) Transfers: Sit to/from Stand Sit to Stand: Min guard         General transfer comment: Cues for hand placement and safety  Ambulation/Gait Ambulation/Gait assistance: Min guard Ambulation Distance (Feet): 180 Feet Assistive device: Rolling walker (2 wheeled) Gait Pattern/deviations: Step-through pattern Gait velocity: decreased Gait velocity interpretation: Below normal speed for age/gender General Gait Details: Cues for gait sequence and RW proximity   Stairs Stairs:  Yes   Stair Management: Two rails;Forwards;Alternating pattern Number of Stairs: 2 General stair comments: Min guard for safety, min cues for sequencing  Wheelchair Mobility    Modified Rankin (Stroke Patients Only)       Balance                                    Cognition Arousal/Alertness: Awake/alert Behavior During Therapy: WFL for tasks assessed/performed Overall Cognitive Status: Within Functional Limits for tasks assessed                      Exercises Total Joint Exercises Ankle Circles/Pumps: AROM;Both;20 reps Quad Sets: AROM;Left;10 reps Gluteal Sets: AROM;Both;10 reps Towel Squeeze: AROM;Both;10 reps Short Arc Quad: AROM;Left;10 reps;Supine Heel Slides: AAROM;Left;10 reps    General Comments        Pertinent Vitals/Pain Pain Assessment: 0-10 Pain Score: 4  Pain Location: L hip Pain Descriptors / Indicators: Sore;Operative site guarding Pain Intervention(s): Monitored during session;Patient requesting pain meds-RN notified;Ice applied;Repositioned    Home Living                      Prior Function            PT Goals (current goals can now be found in the care plan section) Acute Rehab PT Goals Patient Stated Goal: walk without pain Progress towards PT goals: Progressing toward goals    Frequency    7X/week      PT Plan Current plan remains appropriate    Co-evaluation  End of Session Equipment Utilized During Treatment: Gait belt Activity Tolerance: Patient tolerated treatment well Patient left: with call bell/phone within reach;in bed     Time: 0454-0981 PT Time Calculation (min) (ACUTE ONLY): 42 min  Charges:  $Gait Training: 23-37 mins $Therapeutic Exercise: 8-22 mins                    G Codes:      Colin Broach PT, DPT  [phone]  04/11/2016, 10:53 AM

## 2016-04-11 NOTE — Discharge Instructions (Signed)
INSTRUCTIONS AFTER JOINT REPLACEMENT   o Remove items at home which could result in a fall. This includes throw rugs or furniture in walking pathways o ICE to the affected joint every three hours while awake for 30 minutes at a time, for at least the first 3-5 days, and then as needed for pain and swelling.  Continue to use ice for pain and swelling. You may notice swelling that will progress down to the foot and ankle.  This is normal after surgery.  Elevate your leg when you are not up walking on it.   o Continue to use the breathing machine you got in the hospital (incentive spirometer) which will help keep your temperature down.  It is common for your temperature to cycle up and down following surgery, especially at night when you are not up moving around and exerting yourself.  The breathing machine keeps your lungs expanded and your temperature down.   DIET:  As you were doing prior to hospitalization, we recommend a well-balanced diet.  Total Hip Replacement, Care After Refer to this sheet in the next few weeks. These instructions provide you with information on caring for yourself after your procedure. Your health care provider may also give you specific instructions. Your treatment has been planned according to the most current medical practices, but problems sometimes occur. Call your health care provider if you have any problems or questions after your procedure. Follow these instructions at home: Your health care provider will give you specific precautions for certain types of movement. Additional instructions include:  Take medicines only as directed by your health care provider.  Do not take baths, swim, or use a hot tub until your health care provider approves.  Avoid lifting until your health care provider instructs you otherwise.  Use a raised toilet seat and avoid sitting in low chairs as instructed by your health care provider.  Use crutches or a walker as instructed by your  health care provider.  Rest often, but move around as much as you can tolerate. Movement helps you to heal and helps to prevent stiffness, skin sores, and blood clots.  Wear compression stockings as told by your health care provider. These stockings help to prevent blood clots and to reduce swelling in your legs.  Follow instructions from your health care provider about how to take care of your incision. Make sure you:  Wash your hands with soap and water before you change your bandage (dressing). If soap and water are not available, use hand sanitizer.  Change your dressing as told by your health care provider.  Leave stitches (sutures), skin glue, or adhesive strips in place. These skin closures may need to be in place for 2 weeks or longer. If adhesive strip edges start to loosen and curl up, you may trim the loose edges. Do not remove adhesive strips completely unless your health care provider tells you to do that. Contact a health care provider if:  You have difficulty breathing.  You have drainage, redness, or swelling at your incision site.  You have a bad smell coming from your incision site.  You have persistent bleeding from your incision site.  Your incision breaks open after sutures (stitches) or staples have been removed.  You have a fever. Get help right away if:  You have a rash.  You have pain or swelling in your calf or thigh.  You have shortness of breath or chest pain. This information is not intended to replace advice  given to you by your health care provider. Make sure you discuss any questions you have with your health care provider. Document Released: 10/11/2004 Document Revised: 11/26/2015 Document Reviewed: 05/25/2013 Elsevier Interactive Patient Education  2017 [employer]  DRESSING / WOUND CARE / SHOWERING  Keep the surgical dressing until follow up.  The dressing is water proof, so you can shower without any extra covering.  IF THE DRESSING FALLS  OFF or the wound gets wet inside, change the dressing with sterile gauze.  Please use good hand washing techniques before changing the dressing.  Do not use any lotions or creams on the incision until instructed by your surgeon.    ACTIVITY  o Increase activity slowly as tolerated, but follow the weight bearing instructions below.   o No driving for 6 weeks or until further direction given by your physician.  You cannot drive while taking narcotics.  o No lifting or carrying greater than 10 lbs. until further directed by your surgeon. o Avoid periods of inactivity such as sitting longer than an hour when not asleep. This helps prevent blood clots.  o You may return to work once you are authorized by your doctor.     WEIGHT BEARING   Weight bearing as tolerated with assist device (walker, cane, etc) as directed, use it as long as suggested by your surgeon or therapist, typically at least 4-6 weeks.   EXERCISES  Results after joint replacement surgery are often greatly improved when you follow the exercise, range of motion and muscle strengthening exercises prescribed by your doctor. Safety measures are also important to protect the joint from further injury. Any time any of these exercises cause you to have increased pain or swelling, decrease what you are doing until you are comfortable again and then slowly increase them. If you have problems or questions, call your caregiver or physical therapist for advice.   Rehabilitation is important following a joint replacement. After just a few days of immobilization, the muscles of the leg can become weakened and shrink (atrophy).  These exercises are designed to build up the tone and strength of the thigh and leg muscles and to improve motion. Often times heat used for twenty to thirty minutes before working out will loosen up your tissues and help with improving the range of motion but do not use heat for the first two weeks following surgery  (sometimes heat can increase post-operative swelling).   These exercises can be done on a training (exercise) mat, on the floor, on a table or on a bed. Use whatever works the best and is most comfortable for you.    Use music or television while you are exercising so that the exercises are a pleasant break in your day. This will make your life better with the exercises acting as a break in your routine that you can look forward to.   Perform all exercises about fifteen times, three times per day or as directed.  You should exercise both the operative leg and the other leg as well.  Exercises include:    Quad Sets - Tighten up the muscle on the front of the thigh (Quad) and hold for 5-10 seconds.    Straight Leg Raises - With your knee straight (if you were given a brace, keep it on), lift the leg to 60 degrees, hold for 3 seconds, and slowly lower the leg.  Perform this exercise against resistance later as your leg gets stronger.   Leg Slides: Lying  on your back, slowly slide your foot toward your buttocks, bending your knee up off the floor (only go as far as is comfortable). Then slowly slide your foot back down until your leg is flat on the floor again.   Angel Wings: Lying on your back spread your legs to the side as far apart as you can without causing discomfort.   Hamstring Strength:  Lying on your back, push your heel against the floor with your leg straight by tightening up the muscles of your buttocks.  Repeat, but this time bend your knee to a comfortable angle, and push your heel against the floor.  You may put a pillow under the heel to make it more comfortable if necessary.   A rehabilitation program following joint replacement surgery can speed recovery and prevent re-injury in the future due to weakened muscles. Contact your doctor or a physical therapist for more information on knee rehabilitation.    CONSTIPATION  Constipation is defined medically as fewer than three stools  per week and severe constipation as less than one stool per week.  Even if you have a regular bowel pattern at home, your normal regimen is likely to be disrupted due to multiple reasons following surgery.  Combination of anesthesia, postoperative narcotics, change in appetite and fluid intake all can affect your bowels.   YOU MUST use at least one of the following options; they are listed in order of increasing strength to get the job done.  They are all available over the counter, and you may need to use some, POSSIBLY even all of these options:    Drink plenty of fluids (prune juice may be helpful) and high fiber foods Colace 100 mg by mouth twice a day  Senokot for constipation as directed and as needed Dulcolax (bisacodyl), take with full glass of water  Miralax (polyethylene glycol) once or twice a day as needed.  If you have tried all these things and are unable to have a bowel movement in the first 3-4 days after surgery call either your surgeon or your primary doctor.    If you experience loose stools or diarrhea, hold the medications until you stool forms back up.  If your symptoms do not get better within 1 week or if they get worse, check with your doctor.  If you experience "the worst abdominal pain ever" or develop nausea or vomiting, please contact the office immediately for further recommendations for treatment.   ITCHING:  If you experience itching with your medications, try taking only a single pain pill, or even half a pain pill at a time.  You can also use Benadryl over the counter for itching or also to help with sleep.   TED HOSE STOCKINGS:  Use stockings on both legs until for at least 2 weeks or as directed by physician office. They may be removed at night for sleeping.  MEDICATIONS:  See your medication summary on the After Visit Summary that nursing will review with you.  You may have some home medications which will be placed on hold until you complete the course of  blood thinner medication.  It is important for you to complete the blood thinner medication as prescribed.  PRECAUTIONS:  If you experience chest pain or shortness of breath - call 911 immediately for transfer to the hospital emergency department.   If you develop a fever greater that 101 F, purulent drainage from wound, increased redness or drainage from wound, foul odor from the wound/dressing,  or calf pain - CONTACT YOUR SURGEON.                                                   FOLLOW-UP APPOINTMENTS:  If you do not already have a post-op appointment, please call the office for an appointment to be seen by your surgeon.  Guidelines for how soon to be seen are listed in your After Visit Summary, but are typically between 1-4 weeks after surgery.  OTHER INSTRUCTIONS:   Knee Replacement:  Do not place pillow under knee, focus on keeping the knee straight while resting. CPM instructions: 0-90 degrees, 2 hours in the morning, 2 hours in the afternoon, and 2 hours in the evening. Place foam block, curve side up under heel at all times except when in CPM or when walking.  DO NOT modify, tear, cut, or change the foam block in any way.  MAKE SURE YOU:   Understand these instructions.   Get help right away if you are not doing well or get worse.    Thank you for letting us be a part of your medical care team.  It is a privilege we respect greatly.  We hope these instructions will help you stay on track for a fast and full recovery!

## 2016-04-12 DIAGNOSIS — Z96642 Presence of left artificial hip joint: Secondary | ICD-10-CM | POA: Diagnosis not present

## 2016-04-12 DIAGNOSIS — I10 Essential (primary) hypertension: Secondary | ICD-10-CM | POA: Diagnosis not present

## 2016-04-12 DIAGNOSIS — Z96651 Presence of right artificial knee joint: Secondary | ICD-10-CM | POA: Diagnosis not present

## 2016-04-12 DIAGNOSIS — Z471 Aftercare following joint replacement surgery: Secondary | ICD-10-CM | POA: Diagnosis not present

## 2016-04-12 DIAGNOSIS — M1990 Unspecified osteoarthritis, unspecified site: Secondary | ICD-10-CM | POA: Diagnosis not present

## 2016-04-15 DIAGNOSIS — M1990 Unspecified osteoarthritis, unspecified site: Secondary | ICD-10-CM | POA: Diagnosis not present

## 2016-04-15 DIAGNOSIS — I10 Essential (primary) hypertension: Secondary | ICD-10-CM | POA: Diagnosis not present

## 2016-04-15 DIAGNOSIS — Z471 Aftercare following joint replacement surgery: Secondary | ICD-10-CM | POA: Diagnosis not present

## 2016-04-15 DIAGNOSIS — Z96651 Presence of right artificial knee joint: Secondary | ICD-10-CM | POA: Diagnosis not present

## 2016-04-15 DIAGNOSIS — Z96642 Presence of left artificial hip joint: Secondary | ICD-10-CM | POA: Diagnosis not present

## 2016-04-17 DIAGNOSIS — M1990 Unspecified osteoarthritis, unspecified site: Secondary | ICD-10-CM | POA: Diagnosis not present

## 2016-04-17 DIAGNOSIS — Z96642 Presence of left artificial hip joint: Secondary | ICD-10-CM | POA: Diagnosis not present

## 2016-04-17 DIAGNOSIS — Z96651 Presence of right artificial knee joint: Secondary | ICD-10-CM | POA: Diagnosis not present

## 2016-04-17 DIAGNOSIS — I10 Essential (primary) hypertension: Secondary | ICD-10-CM | POA: Diagnosis not present

## 2016-04-17 DIAGNOSIS — Z471 Aftercare following joint replacement surgery: Secondary | ICD-10-CM | POA: Diagnosis not present

## 2016-04-18 ENCOUNTER — Telehealth (INDEPENDENT_AMBULATORY_CARE_PROVIDER_SITE_OTHER): Payer: Self-pay | Admitting: Orthopaedic Surgery

## 2016-04-18 NOTE — Telephone Encounter (Signed)
Pt requesting refill of oxycodone 5-325  States she was 15 left but cannot come pick up rx until Tuesday  She asked if we could fill tramadol for now if we would be able to just send that to her pharmacy?  She wants to discuss what she needs to do.  [phone]

## 2016-04-18 NOTE — Telephone Encounter (Signed)
Send in some tramadol for now, 1-2 every 6-8 hours as needed for pain, #60

## 2016-04-18 NOTE — Telephone Encounter (Signed)
Please advise 

## 2016-04-21 DIAGNOSIS — Z96651 Presence of right artificial knee joint: Secondary | ICD-10-CM | POA: Diagnosis not present

## 2016-04-21 DIAGNOSIS — M1990 Unspecified osteoarthritis, unspecified site: Secondary | ICD-10-CM | POA: Diagnosis not present

## 2016-04-21 DIAGNOSIS — Z96642 Presence of left artificial hip joint: Secondary | ICD-10-CM | POA: Diagnosis not present

## 2016-04-21 DIAGNOSIS — I10 Essential (primary) hypertension: Secondary | ICD-10-CM | POA: Diagnosis not present

## 2016-04-21 DIAGNOSIS — Z471 Aftercare following joint replacement surgery: Secondary | ICD-10-CM | POA: Diagnosis not present

## 2016-04-21 MED ORDER — OXYCODONE-ACETAMINOPHEN 5-325 MG PO TABS: 1.0000 | ORAL_TABLET | Freq: Three times a day (TID) | ORAL | 0 refills | Status: DC | PRN

## 2016-04-21 NOTE — Telephone Encounter (Signed)
LMOM for patient Rx ready at front desk 

## 2016-04-21 NOTE — Telephone Encounter (Signed)
Rx for oxycodone

## 2016-04-21 NOTE — Telephone Encounter (Signed)
Can come and pick up script 

## 2016-04-22 ENCOUNTER — Ambulatory Visit (INDEPENDENT_AMBULATORY_CARE_PROVIDER_SITE_OTHER): Payer: Medicare Other | Admitting: Orthopaedic Surgery

## 2016-04-22 DIAGNOSIS — Z96642 Presence of left artificial hip joint: Secondary | ICD-10-CM

## 2016-04-22 NOTE — Progress Notes (Signed)
The patient is 2 weeks status post a left total hip arthroplasty through a direct anterior approach. She has severe congenital deformity of the hip so it was definitely a difficult replacement but she is doing well. She is ambulating with a walker and says she can tell a difference. She says her pain is definitely less than it was preoperative.  On examination her leg lengths are equal. I removed the staples and placed Steri-Strips are left hip incision. There was no significant effusion or seroma.  At this point I want her to still adhere to anterior hip precautions given the difficulty of her surgery. I would like to see her back in 4 weeks. I would like an AP pelvis at that visit in 4 weeks from now.

## 2016-04-25 DIAGNOSIS — M1990 Unspecified osteoarthritis, unspecified site: Secondary | ICD-10-CM | POA: Diagnosis not present

## 2016-04-25 DIAGNOSIS — Z96651 Presence of right artificial knee joint: Secondary | ICD-10-CM | POA: Diagnosis not present

## 2016-04-25 DIAGNOSIS — Z471 Aftercare following joint replacement surgery: Secondary | ICD-10-CM | POA: Diagnosis not present

## 2016-04-25 DIAGNOSIS — Z96642 Presence of left artificial hip joint: Secondary | ICD-10-CM | POA: Diagnosis not present

## 2016-04-25 DIAGNOSIS — I10 Essential (primary) hypertension: Secondary | ICD-10-CM | POA: Diagnosis not present

## 2016-05-20 ENCOUNTER — Ambulatory Visit (INDEPENDENT_AMBULATORY_CARE_PROVIDER_SITE_OTHER): Payer: Medicare Other | Admitting: Orthopaedic Surgery

## 2016-05-20 ENCOUNTER — Encounter (INDEPENDENT_AMBULATORY_CARE_PROVIDER_SITE_OTHER): Payer: Self-pay | Admitting: Orthopaedic Surgery

## 2016-05-20 ENCOUNTER — Ambulatory Visit (INDEPENDENT_AMBULATORY_CARE_PROVIDER_SITE_OTHER): Payer: Medicare Other

## 2016-05-20 VITALS — Ht <= 58 in | Wt 146.0 lb

## 2016-05-20 DIAGNOSIS — Z96642 Presence of left artificial hip joint: Secondary | ICD-10-CM

## 2016-05-20 NOTE — Progress Notes (Signed)
The patient is now 6 weeks status post a left total hip arthroplasty direct interpose. This is a very difficult surgery due to her significant congenital deformity with a dysplastic hip. Her left leg with significant short compared left and right hips. She is now back out to length. She is very happy with this. She said her hip is stable. She endplates still with a cane because of just lack of confidence right now. But she says she is doing well overall.  She does have fluid internal rotation rotation of her left hip in her leg lengths are equal. She has pain over the IT band to be expected. The hip feels well located. X-rays do confirm a well located hip and her leg lengths rather normal. There is a avulsion of the tip of the greater trochanter was recognized intraoperative is well. This is not affected her stability due the fact we have her set tight. He progressed she understands the IT band syndrome will likely calmed down with intermittent ice heat and stretching. We'll see her back in a month to see how she doing overall but no x-rays are needed. She'll continue her cane until she is comfortable going without.

## 2016-06-17 ENCOUNTER — Ambulatory Visit (INDEPENDENT_AMBULATORY_CARE_PROVIDER_SITE_OTHER): Payer: Medicare Other | Admitting: Orthopaedic Surgery

## 2016-07-02 ENCOUNTER — Ambulatory Visit (INDEPENDENT_AMBULATORY_CARE_PROVIDER_SITE_OTHER): Payer: Medicare Other | Admitting: Orthopaedic Surgery

## 2016-07-02 DIAGNOSIS — Z96642 Presence of left artificial hip joint: Secondary | ICD-10-CM

## 2016-07-02 NOTE — Progress Notes (Signed)
The patient is almost 3 months status post a left total hip replacement. 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 We were able to successfully perform a direct anterior hip replacement. She says she is doing well and range of motion strength are improving daily.  Her leg lengths are near equal. She was severely shortened before. Mikki HarborStoller some easily put her hip the range of motion. She is much less stiff as well.  At this point she'll continue increase her activities. I don't need to see her back for 6 months. At that visit I would like a low AP pelvis and a left lateral hip.

## 2016-11-07 DIAGNOSIS — J209 Acute bronchitis, unspecified: Secondary | ICD-10-CM | POA: Diagnosis not present

## 2016-11-07 DIAGNOSIS — J069 Acute upper respiratory infection, unspecified: Secondary | ICD-10-CM | POA: Diagnosis not present

## 2016-11-07 DIAGNOSIS — J329 Chronic sinusitis, unspecified: Secondary | ICD-10-CM | POA: Diagnosis not present

## 2016-11-07 DIAGNOSIS — J029 Acute pharyngitis, unspecified: Secondary | ICD-10-CM | POA: Diagnosis not present

## 2017-01-01 ENCOUNTER — Ambulatory Visit (INDEPENDENT_AMBULATORY_CARE_PROVIDER_SITE_OTHER): Payer: Medicare Other

## 2017-01-01 ENCOUNTER — Ambulatory Visit (INDEPENDENT_AMBULATORY_CARE_PROVIDER_SITE_OTHER): Payer: Medicare Other | Admitting: Orthopaedic Surgery

## 2017-01-01 DIAGNOSIS — Z96642 Presence of left artificial hip joint: Secondary | ICD-10-CM | POA: Diagnosis not present

## 2017-01-01 NOTE — Progress Notes (Signed)
The patient is now 9 months status post a left total hip arthroplasty through direct anterior approach. 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 We were able to perform a direct total hip arthroplasty and she has done significantly well. She has no complaints at all. She says her leg lengths are equal and she's walking great. She has good range of motion strength she states.  On exam her leg lengths are equal. I can put her hip through full internal right rotation left side does not bother her 1 bit. X-rays are obtained and it shows a well-seated implant with no complicating features. He can see where she had calcifications around the trochanteric area due to interoperative difficulty getting the replacement and due to her congenital deformity but she has done incredibly well. She's had no symptoms of instability that hip at all.  At this point she'll follow-up as needed. We had a long discussion about things and would need to bring her back. All questions this surgery answered and addressed.

## 2017-01-06 DIAGNOSIS — Z23 Encounter for immunization: Secondary | ICD-10-CM | POA: Diagnosis not present

## 2017-03-26 DIAGNOSIS — Z6835 Body mass index (BMI) 35.0-35.9, adult: Secondary | ICD-10-CM | POA: Diagnosis not present

## 2017-03-26 DIAGNOSIS — E782 Mixed hyperlipidemia: Secondary | ICD-10-CM | POA: Diagnosis not present

## 2017-03-26 DIAGNOSIS — Z1389 Encounter for screening for other disorder: Secondary | ICD-10-CM | POA: Diagnosis not present

## 2017-03-26 DIAGNOSIS — I10 Essential (primary) hypertension: Secondary | ICD-10-CM | POA: Diagnosis not present

## 2017-03-26 DIAGNOSIS — E063 Autoimmune thyroiditis: Secondary | ICD-10-CM | POA: Diagnosis not present

## 2017-03-26 DIAGNOSIS — E6609 Other obesity due to excess calories: Secondary | ICD-10-CM | POA: Diagnosis not present

## 2017-04-23 DIAGNOSIS — H04123 Dry eye syndrome of bilateral lacrimal glands: Secondary | ICD-10-CM | POA: Diagnosis not present

## 2017-04-23 DIAGNOSIS — H2513 Age-related nuclear cataract, bilateral: Secondary | ICD-10-CM | POA: Diagnosis not present

## 2017-04-23 DIAGNOSIS — H524 Presbyopia: Secondary | ICD-10-CM | POA: Diagnosis not present

## 2017-06-24 DIAGNOSIS — R739 Hyperglycemia, unspecified: Secondary | ICD-10-CM | POA: Diagnosis not present

## 2017-06-24 DIAGNOSIS — R946 Abnormal results of thyroid function studies: Secondary | ICD-10-CM | POA: Diagnosis not present

## 2017-06-24 DIAGNOSIS — R7309 Other abnormal glucose: Secondary | ICD-10-CM | POA: Diagnosis not present

## 2017-06-24 DIAGNOSIS — E782 Mixed hyperlipidemia: Secondary | ICD-10-CM | POA: Diagnosis not present

## 2017-06-24 DIAGNOSIS — Z1389 Encounter for screening for other disorder: Secondary | ICD-10-CM | POA: Diagnosis not present

## 2017-06-24 DIAGNOSIS — E785 Hyperlipidemia, unspecified: Secondary | ICD-10-CM | POA: Diagnosis not present

## 2017-08-03 DIAGNOSIS — E039 Hypothyroidism, unspecified: Secondary | ICD-10-CM | POA: Diagnosis not present

## 2017-11-25 DIAGNOSIS — H1011 Acute atopic conjunctivitis, right eye: Secondary | ICD-10-CM | POA: Diagnosis not present

## 2017-12-17 DIAGNOSIS — J069 Acute upper respiratory infection, unspecified: Secondary | ICD-10-CM | POA: Diagnosis not present

## 2017-12-17 DIAGNOSIS — J329 Chronic sinusitis, unspecified: Secondary | ICD-10-CM | POA: Diagnosis not present

## 2017-12-29 DIAGNOSIS — Z23 Encounter for immunization: Secondary | ICD-10-CM | POA: Diagnosis not present

## 2018-02-24 IMAGING — RF DG C-ARM 61-120 MIN
1 series · 5 of 5 positions shown · non-contrast
Comparison: 01/14/2016 radiographs of the left hip

CLINICAL DATA: Left hip arthroplasty

EXAM:
DG C-ARM 61-120 MIN; OPERATIVE LEFT HIP WITH PELVIS

[Series 1: run · 5 of 5 slices shown]
[im 1/5]
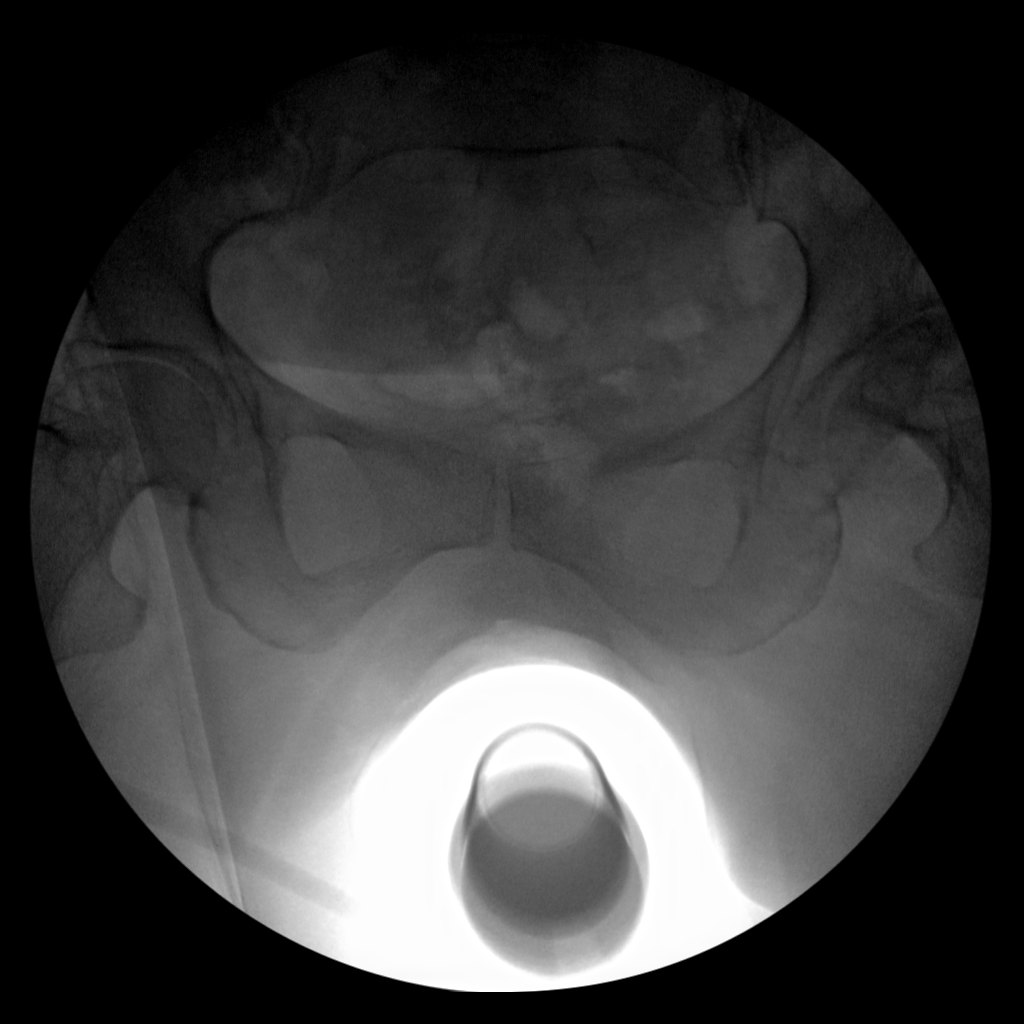
[im 2/5]
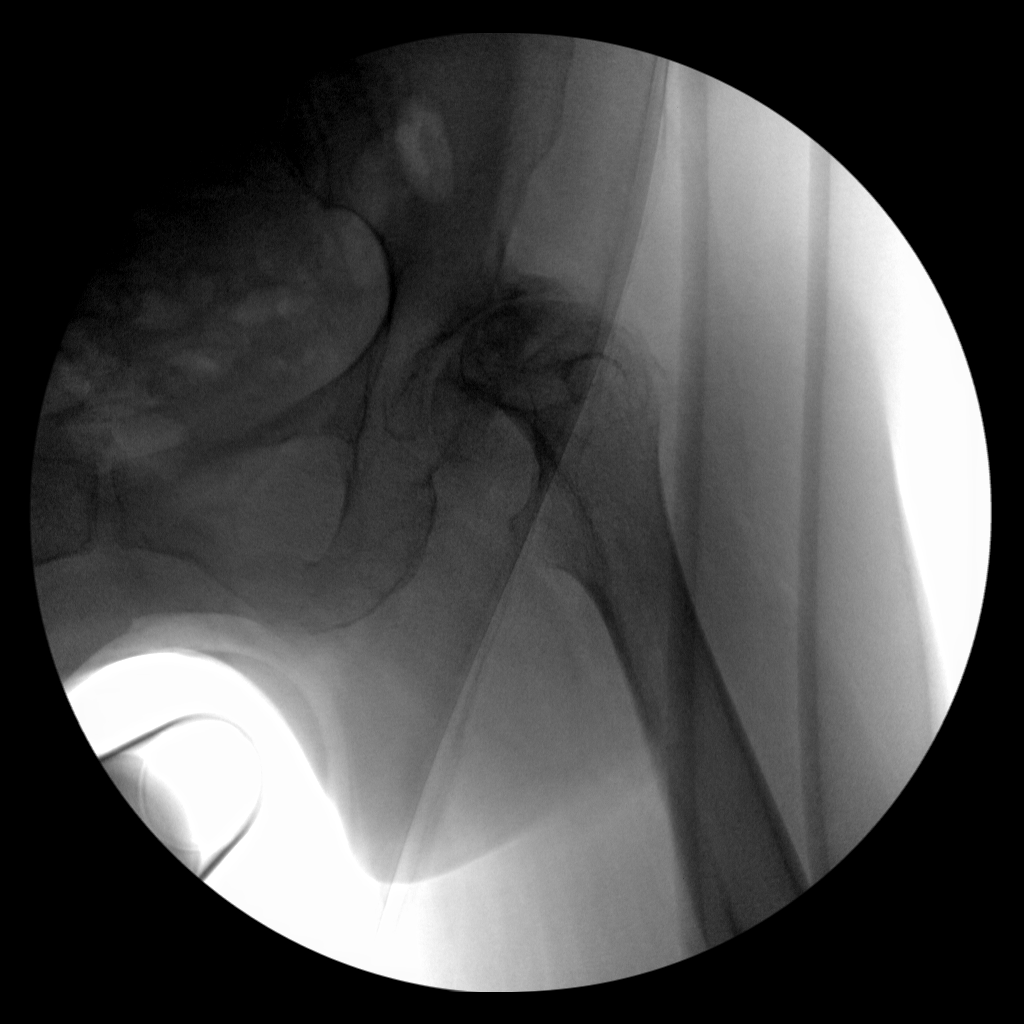
[im 3/5]
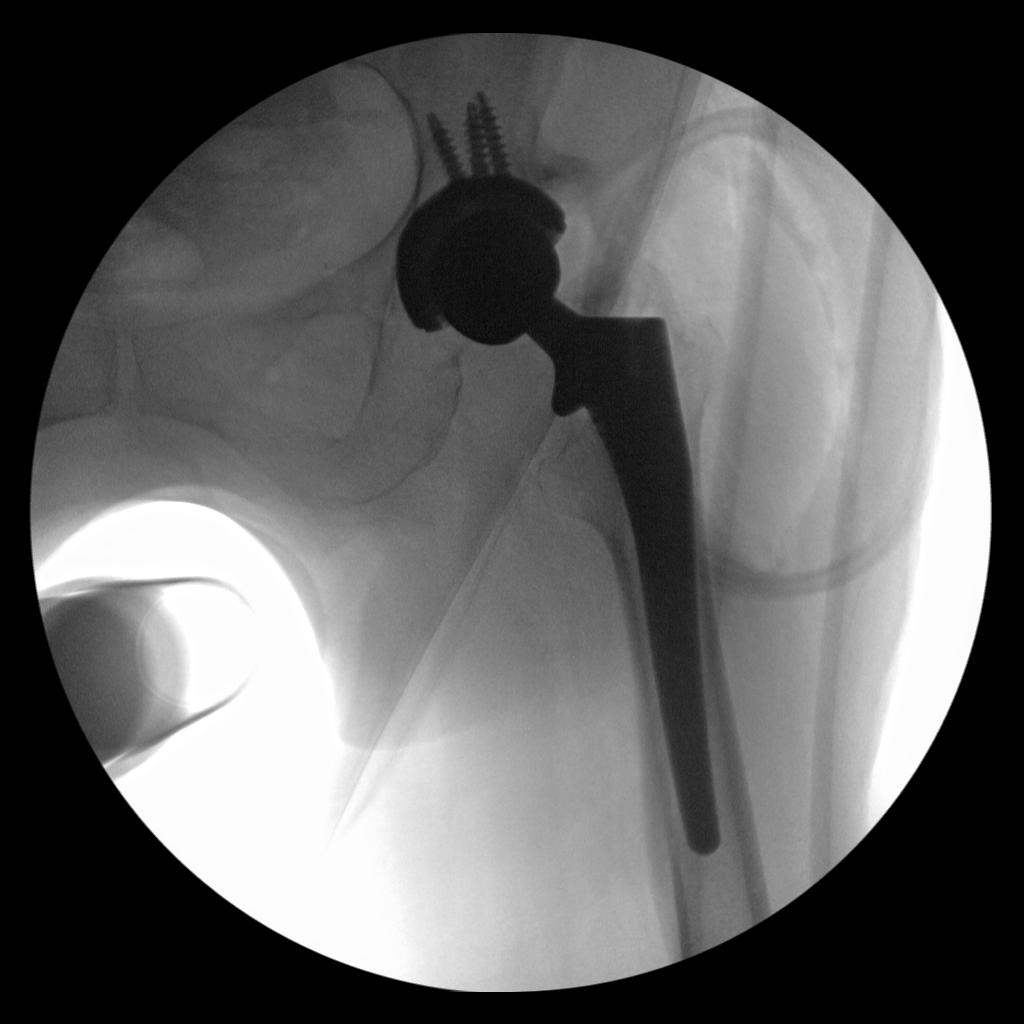
[im 4/5]
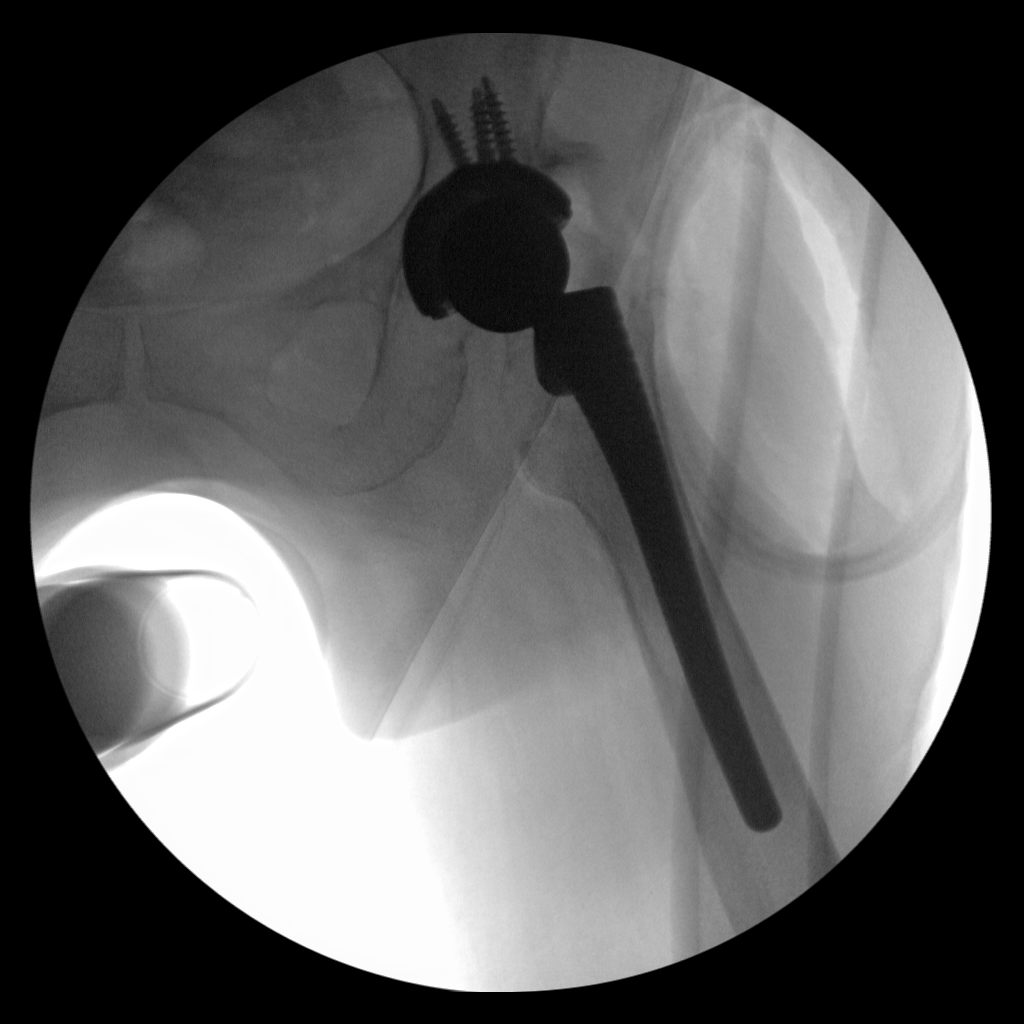
[im 5/5]
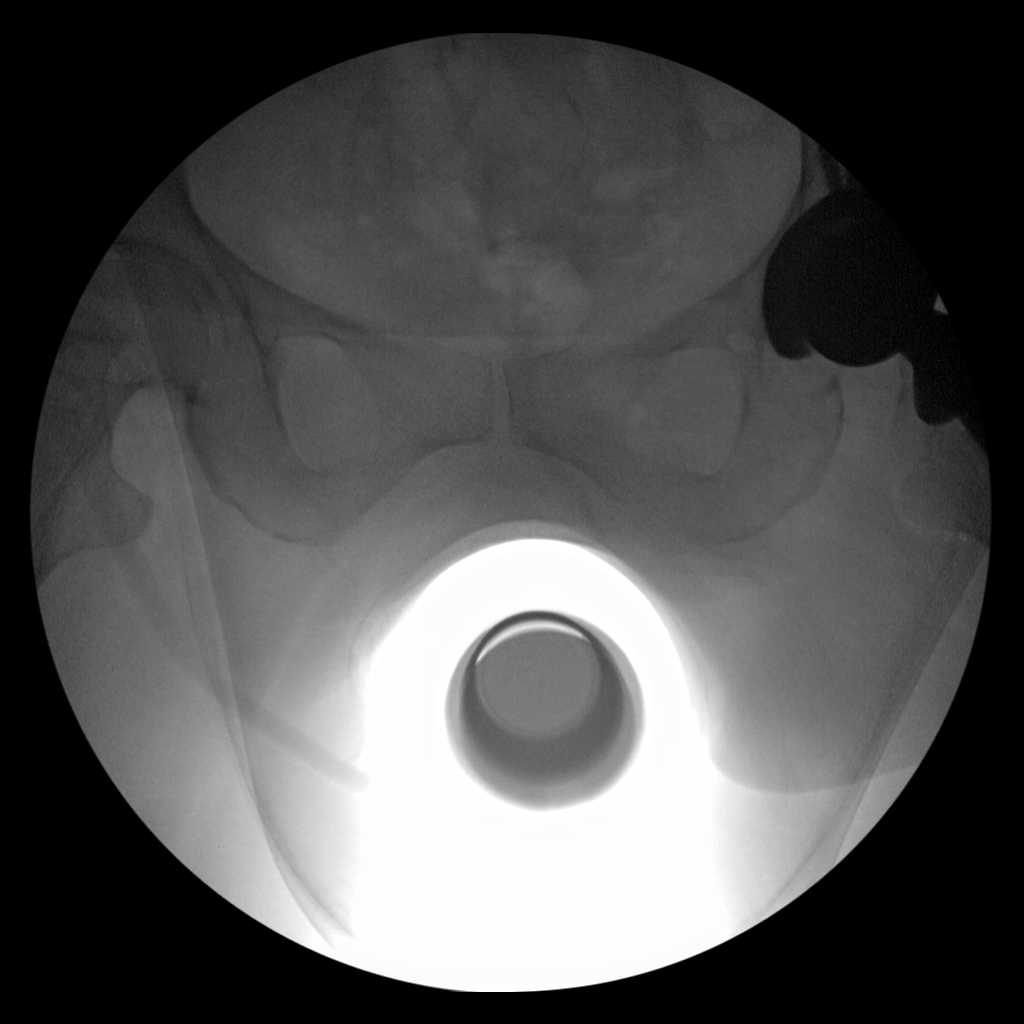

[5 of 5 positions shown; findings below may reference images not displayed]

FINDINGS: Five C-arm fluoroscopic views during left hip arthroplasty are
provided. Prior to left hip arthroplasty there is marked
osteoarthritic joint space narrowing of the left hip with subluxed
left femoral head superolaterally. Joint space narrowing is seen of
the right hip as well. Subchondral sclerosis and degenerate cystic
change noted about the left hip. Intact hardware noted on the AP and
frog-leg views post procedure.
IMPRESSION: Intact left total hip arthroplasty with uncemented femoral stem.

## 2018-03-10 DIAGNOSIS — J209 Acute bronchitis, unspecified: Secondary | ICD-10-CM | POA: Diagnosis not present

## 2018-03-10 DIAGNOSIS — J329 Chronic sinusitis, unspecified: Secondary | ICD-10-CM | POA: Diagnosis not present

## 2018-03-10 DIAGNOSIS — J111 Influenza due to unidentified influenza virus with other respiratory manifestations: Secondary | ICD-10-CM | POA: Diagnosis not present

## 2018-04-29 DIAGNOSIS — H35033 Hypertensive retinopathy, bilateral: Secondary | ICD-10-CM | POA: Diagnosis not present

## 2018-04-29 DIAGNOSIS — H2513 Age-related nuclear cataract, bilateral: Secondary | ICD-10-CM | POA: Diagnosis not present

## 2018-04-29 DIAGNOSIS — H16141 Punctate keratitis, right eye: Secondary | ICD-10-CM | POA: Diagnosis not present

## 2018-04-29 DIAGNOSIS — H524 Presbyopia: Secondary | ICD-10-CM | POA: Diagnosis not present

## 2018-04-29 DIAGNOSIS — I10 Essential (primary) hypertension: Secondary | ICD-10-CM | POA: Diagnosis not present

## 2018-09-29 ENCOUNTER — Encounter: Payer: Self-pay | Admitting: Orthopaedic Surgery

## 2018-09-29 ENCOUNTER — Ambulatory Visit (INDEPENDENT_AMBULATORY_CARE_PROVIDER_SITE_OTHER): Payer: Medicare Other | Admitting: Orthopaedic Surgery

## 2018-09-29 ENCOUNTER — Other Ambulatory Visit: Payer: Self-pay

## 2018-09-29 ENCOUNTER — Ambulatory Visit (INDEPENDENT_AMBULATORY_CARE_PROVIDER_SITE_OTHER): Payer: Medicare Other

## 2018-09-29 ENCOUNTER — Other Ambulatory Visit: Payer: Self-pay | Admitting: Orthopaedic Surgery

## 2018-09-29 VITALS — Ht <= 58 in | Wt 160.0 lb

## 2018-09-29 DIAGNOSIS — M1611 Unilateral primary osteoarthritis, right hip: Secondary | ICD-10-CM | POA: Insufficient documentation

## 2018-09-29 MED ORDER — ACETAMINOPHEN-CODEINE #3 300-30 MG PO TABS: 1.0000 | ORAL_TABLET | Freq: Three times a day (TID) | ORAL | 0 refills | Status: DC | PRN

## 2018-09-29 NOTE — Progress Notes (Signed)
Office Visit Note   Patient: Cindy MunsonMargaret H George           Date of Birth: 1950-10-13           MRN: [id number] Visit Date: 09/29/2018              Requested by: Assunta FoundGolding, John, MD [address] [place],  [place] [id number] PCP: Assunta FoundGolding, John, MD   Assessment & Plan: Visit Diagnoses:  1. Osteoarthritis of right hip, unspecified osteoarthritis type   2. Unilateral primary osteoarthritis, right hip     Plan: Given her clinical exam findings and x-ray findings we are recommending a total hip arthroplasty on the right side.  She is tried failed conservative treatment for over 12 months now.  Her hip pain is daily and it is severe.  Having had the surgery before she is fully aware of the risk of acute blood loss anemia, nerve vessel injury, fracture, infection, DVT, dislocation and implant failure.  She understands her goals are decrease pain, improve mobility and improve quality of life.  We once again went over x-rays with her.  We talked about the replacement surgery.  We talked about her intraoperative and postoperative course.  Again she is fully aware of this having had surgery on her left hip before.  We will work on getting this scheduled.  All question concerns were answered and addressed.  Follow-Up Instructions: Return for 2 weeks post-op.   Orders:  Orders Placed This Encounter  Procedures  . XR HIP UNILAT W OR W/O PELVIS 1V RIGHT   No orders of the defined types were placed in this encounter.     Procedures: No procedures performed   Clinical Data: No additional findings.   Subjective: Chief Complaint  Patient presents with  . Right Hip - Pain  The patient is well-known to me.  He has a history of a left total hip arthroplasty due to severe hip dysplasia and eventual osteoarthritis that was associated with that.  This was done in 2018.  At that time her right hip showed significant arthritis but now this is worsened with time.  Her left hip is doing well.  Her  right hip is now hurt for over a year and a half.  It is 10 out of 10 pain at this point.  It is daily pain.  Her right hip pain has now detrimentally affecting her mobility, her quality of life, and her actives of daily living.  It hurts with pivoting activities as well.  She is worked on activity modification and weight loss.  She is using assistive device to ambulate with.  She has been on anti-inflammatories as well.  She is tried hip strengthening exercises that were taught to her in the past.  None of this is helped.  HPI  Review of Systems She currently denies any headache, chest pain, shortness of breath, fever, chills, nausea, vomiting  Objective: Vital Signs: Ht 4\' 10"  (1.473 m)   Wt 160 lb (72.6 kg)   BMI 33.44 kg/m   Physical Exam She is alert and orient x3 and in no acute distress Ortho Exam Examination of her left hip shows that he moves fluidly.  Examination of her right hip shows significant limitations with any internal and external rotation.  There is also severe pain with rotation. Specialty Comments:  No specialty comments available.  Imaging: Xr Hip Unilat W Or W/o Pelvis 1v Right  Result Date: 09/29/2018 An AP pelvis and lateral of the right  hip shows severe end-stage arthritis of the right hip.  There is complete loss of the joint space.  There is flattening of the femoral head and severe sclerotic changes.  There are large osteophytes around the hip joint.  There is a left total hip arthroplasty that on the AP view of the pelvis appears well-seated with no complicating features.    PMFS History: Patient Active Problem List   Diagnosis Date Noted  . Unilateral primary osteoarthritis, right hip 09/29/2018  . 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 Status post left hip replacement 04/08/2016  . Osteoarthritis of right knee 05/25/2012  . Stiffness of joint, not elsewhere classified, lower leg 06/30/2011  . Swelling of joint of  right knee 06/30/2011  . Difficulty in walking(719.7) 06/30/2011  . Weakness of right leg 06/30/2011  . S/P total knee replacement 06/02/2011  . ARTHRITIS, RIGHT KNEE 09/10/2009  . BUNION, LEFT FOOT 09/10/2009  . HAMMER TOE 09/10/2009  . CLOSED FRACTURE OF UPPER END OF FIBULA 07/16/2009  . HIGH BLOOD PRESSURE 07/16/2009   Past Medical History:  Diagnosis Date  . Anemia    takes Ferrous Sulfate daily  . Arthritis   . Complication of anesthesia   . High blood pressure    takes Avalide daily  . High cholesterol    takes Atorvastatin daily  . History of bronchitis 2016  . History of kidney stones   . Hypothyroidism    takes Synthroid daily  . Joint pain   . Pneumonia    at age 56  . Urinary urgency     History reviewed. No pertinent family history.  Past Surgical History:  Procedure Laterality Date  . colonscopy  2010  . KIDNEY STONE SURGERY  2011  . THYROID SURGERY    . TOTAL ABDOMINAL HYSTERECTOMY    . TOTAL HIP ARTHROPLASTY Left 04/08/2016   Procedure: LEFT TOTAL HIP ARTHROPLASTY ANTERIOR APPROACH;  Surgeon: Mcarthur Rossetti, MD;  Location: [facility];  Service: Orthopedics;  Laterality: Left;  . TOTAL KNEE ARTHROPLASTY  05/19/2011   Procedure: TOTAL KNEE ARTHROPLASTY;  Surgeon: Arther Abbott, MD;  Location: [facility];  Service: Orthopedics;  Laterality: Right;  computer assisted depuy  . TUBAL LIGATION     27 yrs ago   Social History   Occupational History  . Not on file  Tobacco Use  . Smoking status: Never Smoker  . Smokeless tobacco: Never Used  Substance and Sexual Activity  . Alcohol use: No  . Drug use: No  . Sexual activity: Never

## 2018-10-07 DIAGNOSIS — I10 Essential (primary) hypertension: Secondary | ICD-10-CM | POA: Diagnosis not present

## 2018-10-07 DIAGNOSIS — E063 Autoimmune thyroiditis: Secondary | ICD-10-CM | POA: Diagnosis not present

## 2018-10-07 DIAGNOSIS — Z0001 Encounter for general adult medical examination with abnormal findings: Secondary | ICD-10-CM | POA: Diagnosis not present

## 2018-10-07 DIAGNOSIS — R000 Tachycardia, unspecified: Secondary | ICD-10-CM | POA: Diagnosis not present

## 2018-10-07 DIAGNOSIS — Z6833 Body mass index (BMI) 33.0-33.9, adult: Secondary | ICD-10-CM | POA: Diagnosis not present

## 2018-10-11 ENCOUNTER — Telehealth: Payer: Self-pay | Admitting: Orthopaedic Surgery

## 2018-10-11 NOTE — Telephone Encounter (Signed)
Pt called in requesting a handicap sticker, said if she is able to have a form filled out by dr.blackman  [phone]

## 2018-10-11 NOTE — Telephone Encounter (Signed)
That will be ok 

## 2018-10-11 NOTE — Telephone Encounter (Signed)
Is this something you want me to do?

## 2018-10-11 NOTE — Telephone Encounter (Signed)
Patient aware note ready at front desk 

## 2018-10-14 DIAGNOSIS — E063 Autoimmune thyroiditis: Secondary | ICD-10-CM | POA: Diagnosis not present

## 2018-10-14 DIAGNOSIS — R739 Hyperglycemia, unspecified: Secondary | ICD-10-CM | POA: Diagnosis not present

## 2018-10-14 DIAGNOSIS — I10 Essential (primary) hypertension: Secondary | ICD-10-CM | POA: Diagnosis not present

## 2018-10-14 DIAGNOSIS — E782 Mixed hyperlipidemia: Secondary | ICD-10-CM | POA: Diagnosis not present

## 2018-10-14 DIAGNOSIS — R7309 Other abnormal glucose: Secondary | ICD-10-CM | POA: Diagnosis not present

## 2018-10-20 ENCOUNTER — Other Ambulatory Visit: Payer: Self-pay | Admitting: Orthopaedic Surgery

## 2018-10-20 NOTE — Telephone Encounter (Signed)
Please advise 

## 2018-10-25 ENCOUNTER — Other Ambulatory Visit: Payer: Self-pay | Admitting: Physician Assistant

## 2018-10-27 ENCOUNTER — Encounter (HOSPITAL_COMMUNITY)
Admission: RE | Admit: 2018-10-27 | Discharge: 2018-10-27 | Disposition: A | Discharge status: Home / Self Care | Payer: Medicare Other | Source: Ambulatory Visit | Attending: Orthopaedic Surgery | Admitting: Orthopaedic Surgery

## 2018-10-27 ENCOUNTER — Other Ambulatory Visit: Payer: Self-pay

## 2018-10-27 ENCOUNTER — Encounter (HOSPITAL_COMMUNITY): Payer: Self-pay

## 2018-10-27 DIAGNOSIS — Z01812 Encounter for preprocedural laboratory examination: Secondary | ICD-10-CM | POA: Diagnosis not present

## 2018-10-27 HISTORY — DX: Cardiac murmur, unspecified: R01.1

## 2018-10-27 HISTORY — DX: Nausea with vomiting, unspecified: R11.2

## 2018-10-27 HISTORY — DX: Other specified postprocedural states: Z98.890

## 2018-10-27 LAB — BASIC METABOLIC PANEL
Anion gap: 12 (ref 5–15)
BUN: 19 mg/dL (ref 8–23)
CO2: 25 mmol/L (ref 22–32)
Calcium: 9.6 mg/dL (ref 8.9–10.3)
Chloride: 104 mmol/L (ref 98–111)
Creatinine, Ser: 0.73 mg/dL (ref 0.44–1.00)
GFR calc Af Amer: >60 mL/min (ref >60)
GFR calc non Af Amer: >60 mL/min (ref >60)
Glucose, Bld: 104 mg/dL — ABNORMAL HIGH (ref 70–99)
Potassium: 3.7 mmol/L (ref 3.5–5.1)
Sodium: 141 mmol/L (ref 135–145)

## 2018-10-27 LAB — SURGICAL PCR SCREEN
MRSA, PCR: NEGATIVE
Staphylococcus aureus: NEGATIVE

## 2018-10-27 LAB — CBC
HCT: 38.1 % (ref 36.0–46.0)
Hemoglobin: 12.8 g/dL (ref 12.0–15.0)
MCH: 30.5 pg (ref 26.0–34.0)
MCHC: 33.6 g/dL (ref 30.0–36.0)
MCV: 90.7 fL (ref 80.0–100.0)
Platelets: 193 10*3/uL (ref 150–400)
RBC: 4.2 MIL/uL (ref 3.87–5.11)
RDW: 11.8 % (ref 11.5–15.5)
WBC: 5.8 10*3/uL (ref 4.0–10.5)
nRBC: 0 % (ref 0.0–0.2)

## 2018-10-27 NOTE — Progress Notes (Signed)
PCP - Sharilyn Sites @ [facility] in [place] - na  Chest x-ray - na EKG - requested from [facility] - na Cardiac Cath - na  Sleep Study - na CPAP -   Fasting Blood Sugar - na Checks Blood Sugar _____ times a day  Blood Thinner Instructions: Aspirin Instructions: pt. Hasn't been instructed. Pt. Will call surgeon.  Anesthesia review:   Patient denies shortness of breath, fever, cough and chest pain at PAT appointment   Patient verbalized understanding of instructions that were given to them at the PAT appointment. Patient was also instructed that they will need to review over the PAT instructions again at home before surgery.

## 2018-10-27 NOTE — Pre-Procedure Instructions (Signed)
Dorcas McmurrayMargaret H Bienvenue  10/27/2018      [facility] - [place], [place] - [address] [place] [id number] Phone: [phone] Fax: [phone]    Your procedure is scheduled on July 28.  [facility] at 12:30 A.M.  Call this number if you have problems the morning of surgery:  [phone]   Remember:  Do not eat  after midnight.   You may drink clear liquids until 11:30.  Clear liquids allowed are:                    Water, Juice (non-citric and without pulp), Carbonated beverages, Clear Tea, Black Coffee only, Plain Jell-O only and Gatorade                     Please complete your PRE-SURGERY ENSURE that was provided to you by 11:30 the morning of surgery.  Please, if able, drink it in one setting. DO NOT SIP.    Take these medicines the morning of surgery with A SIP OF WATER :               Tylenol #3 if needed               Benadryl if needed               Levothyroxine (synthroid)               Eye drops if needed              7 days prior to surgery STOP taking any Aspirin (unless otherwise instructed by your surgeon), Aleve, Naproxen, Ibuprofen, Motrin, Advil, Goody's, BC's, all herbal medications, fish oil, and all vitamins.                Follow your surgeon's instructions on when to stop Aspirin.  If no instructions were given by your surgeon then you will need to call the office to get those instructions.      Do not wear jewelry, make-up or nail polish.  Do not wear lotions, powders, or perfumes, or deodorant.  Do not shave 48 hours prior to surgery.  Men may shave face and neck.  Do not bring valuables to the hospital.  [facility] is not responsible for any belongings or valuables.  Contacts, dentures or bridgework may not be worn into surgery.  Leave your suitcase in the car.  After surgery it may be brought to your room.  For patients admitted to the hospital, discharge time will be determined by  your treatment team.  Patients discharged the day of surgery will not be allowed to drive home.   Special instructions:   [place]- Preparing For Surgery  Before surgery, you can play an important role. Because skin is not sterile, your skin needs to be as free of germs as possible. You can reduce the number of germs on your skin by washing with CHG (chlorahexidine gluconate) Soap before surgery.  CHG is an antiseptic cleaner which kills germs and bonds with the skin to continue killing germs even after washing.    Oral Hygiene is also important to reduce your risk of infection.  Remember - BRUSH YOUR TEETH THE MORNING OF SURGERY WITH YOUR REGULAR TOOTHPASTE  Please do not use if you have an allergy to CHG or antibacterial soaps. If your skin becomes reddened/irritated stop using the CHG.  Do not shave (  including legs and underarms) for at least 48 hours prior to first CHG shower. It is OK to shave your face.  Please follow these instructions carefully.   1. Shower the NIGHT BEFORE SURGERY and the MORNING OF SURGERY with CHG.   2. If you chose to wash your hair, wash your hair first as usual with your normal shampoo.  3. After you shampoo, rinse your hair and body thoroughly to remove the shampoo.  4. Use CHG as you would any other liquid soap. You can apply CHG directly to the skin and wash gently with a scrungie or a clean washcloth.   5. Apply the CHG Soap to your body ONLY FROM THE NECK DOWN.  Do not use on open wounds or open sores. Avoid contact with your eyes, ears, mouth and genitals (private parts). Wash Face and genitals (private parts)  with your normal soap.  6. Wash thoroughly, paying special attention to the area where your surgery will be performed.  7. Thoroughly rinse your body with warm water from the neck down.  8. DO NOT shower/wash with your normal soap after using and rinsing off the CHG Soap.  9. Pat yourself dry with a CLEAN TOWEL.  10. Wear CLEAN PAJAMAS  to bed the night before surgery, wear comfortable clothes the morning of surgery  11. Place CLEAN SHEETS on your bed the night of your first shower and DO NOT SLEEP WITH PETS.    Day of Surgery:  Do not apply any deodorants/lotions.  Please wear clean clothes to the hospital/surgery center.   Remember to brush your teeth WITH YOUR REGULAR TOOTHPASTE.    Please read over the following fact sheets that you were given. Coughing and Deep Breathing, MRSA Information and Surgical Site Infection Prevention

## 2018-10-29 ENCOUNTER — Other Ambulatory Visit (HOSPITAL_COMMUNITY)
Admission: RE | Admit: 2018-10-29 | Discharge: 2018-10-29 | Disposition: A | Discharge status: Home / Self Care | Payer: Medicare Other | Source: Ambulatory Visit | Attending: Orthopaedic Surgery | Admitting: Orthopaedic Surgery

## 2018-10-29 DIAGNOSIS — Z01812 Encounter for preprocedural laboratory examination: Secondary | ICD-10-CM | POA: Diagnosis not present

## 2018-10-30 LAB — SARS CORONAVIRUS 2 (TAT 6-24 HRS): SARS Coronavirus 2: NEGATIVE

## 2018-11-01 ENCOUNTER — Telehealth: Payer: Self-pay | Admitting: *Deleted

## 2018-11-01 NOTE — Telephone Encounter (Signed)
Ortho bundle pre-op call completed. 

## 2018-11-01 NOTE — Care Plan (Signed)
RNCM spoke with patient for a pre-op call. Patient is an Ortho bundle patient that is having surgery with Dr. Ninfa Linden on 11/02/18 at [facility] for a Right total hip arthroplasty. She verbalized she will have assistance from her family (Daughter) after discharge home. She has a FWW as well as a 3 in 1 at home and will need no other DME. She has had a previous Total hip replacement of the Left hip previously and reports she has done well after that. She did have HHPT, which she reports was [facility]. RNCM will make referral for HHPT for anticipated post-discharge needs. Reviewed all information related to surgery and post-discharge instructions. She was able to ask questions. She is aware that RNCM will follow along with her status and rehab. Will continue to monitor for all case management needs.

## 2018-11-02 ENCOUNTER — Ambulatory Visit (HOSPITAL_COMMUNITY): Payer: Medicare Other | Admitting: Certified Registered Nurse Anesthetist

## 2018-11-02 ENCOUNTER — Inpatient Hospital Stay (HOSPITAL_COMMUNITY): Payer: Medicare Other

## 2018-11-02 ENCOUNTER — Ambulatory Visit (HOSPITAL_COMMUNITY): Payer: Medicare Other

## 2018-11-02 ENCOUNTER — Inpatient Hospital Stay (HOSPITAL_COMMUNITY)
Admission: RE | Admit: 2018-11-02 | Discharge: 2018-11-04 | DRG: 470 | Disposition: A | Discharge status: Home Health | Payer: Medicare Other | Attending: Orthopaedic Surgery | Admitting: Orthopaedic Surgery

## 2018-11-02 ENCOUNTER — Ambulatory Visit (HOSPITAL_COMMUNITY): Payer: Medicare Other | Admitting: Physician Assistant

## 2018-11-02 ENCOUNTER — Encounter (HOSPITAL_COMMUNITY)
Admission: RE | Disposition: A | Discharge status: Home Health | Payer: Self-pay | Source: Home / Self Care | Attending: Orthopaedic Surgery

## 2018-11-02 DIAGNOSIS — I10 Essential (primary) hypertension: Secondary | ICD-10-CM | POA: Diagnosis present

## 2018-11-02 DIAGNOSIS — Z471 Aftercare following joint replacement surgery: Secondary | ICD-10-CM | POA: Diagnosis not present

## 2018-11-02 DIAGNOSIS — D62 Acute posthemorrhagic anemia: Secondary | ICD-10-CM | POA: Diagnosis not present

## 2018-11-02 DIAGNOSIS — Z419 Encounter for procedure for purposes other than remedying health state, unspecified: Secondary | ICD-10-CM

## 2018-11-02 DIAGNOSIS — M1631 Unilateral osteoarthritis resulting from hip dysplasia, right hip: Secondary | ICD-10-CM | POA: Diagnosis not present

## 2018-11-02 DIAGNOSIS — M1611 Unilateral primary osteoarthritis, right hip: Secondary | ICD-10-CM | POA: Diagnosis present

## 2018-11-02 DIAGNOSIS — Z96651 Presence of right artificial knee joint: Secondary | ICD-10-CM | POA: Diagnosis present

## 2018-11-02 DIAGNOSIS — E7800 Pure hypercholesterolemia, unspecified: Secondary | ICD-10-CM | POA: Diagnosis present

## 2018-11-02 DIAGNOSIS — E039 Hypothyroidism, unspecified: Secondary | ICD-10-CM | POA: Diagnosis present

## 2018-11-02 DIAGNOSIS — Z79899 Other long term (current) drug therapy: Secondary | ICD-10-CM | POA: Diagnosis not present

## 2018-11-02 DIAGNOSIS — Z79890 Hormone replacement therapy (postmenopausal): Secondary | ICD-10-CM | POA: Diagnosis not present

## 2018-11-02 DIAGNOSIS — Z96642 Presence of left artificial hip joint: Secondary | ICD-10-CM | POA: Diagnosis present

## 2018-11-02 DIAGNOSIS — Z96641 Presence of right artificial hip joint: Secondary | ICD-10-CM

## 2018-11-02 DIAGNOSIS — Z90710 Acquired absence of both cervix and uterus: Secondary | ICD-10-CM

## 2018-11-02 DIAGNOSIS — D649 Anemia, unspecified: Secondary | ICD-10-CM | POA: Diagnosis not present

## 2018-11-02 HISTORY — PX: TOTAL HIP ARTHROPLASTY: SHX124

## 2018-11-02 SURGERY — ARTHROPLASTY, HIP, TOTAL, ANTERIOR APPROACH
Anesthesia: Spinal | Site: Hip | Laterality: Right

## 2018-11-02 MED ORDER — HYDROCHLOROTHIAZIDE 12.5 MG PO CAPS
12.5000 mg | ORAL_CAPSULE | Freq: Every day | ORAL | Status: DC
  Administered 2018-11-02 – 2018-11-03 (×2): 12.5 mg via ORAL
  Filled 2018-11-02 (×2): qty 1

## 2018-11-02 MED ORDER — CHLORHEXIDINE GLUCONATE 4 % EX LIQD: 60.0000 mL | Freq: Once | CUTANEOUS | Status: DC

## 2018-11-02 MED ORDER — ASPIRIN 81 MG PO CHEW
81.0000 mg | CHEWABLE_TABLET | Freq: Two times a day (BID) | ORAL | Status: DC
  Administered 2018-11-02 – 2018-11-04 (×4): 81 mg via ORAL
  Filled 2018-11-02 (×4): qty 1

## 2018-11-02 MED ORDER — FENTANYL CITRATE (PF) 100 MCG/2ML IJ SOLN
25.0000 ug | INTRAMUSCULAR | Status: DC | PRN
  Administered 2018-11-02 (×2): 25 ug via INTRAVENOUS

## 2018-11-02 MED ORDER — LEVOTHYROXINE SODIUM 75 MCG PO TABS
175.0000 ug | ORAL_TABLET | Freq: Every day | ORAL | Status: DC
  Administered 2018-11-03 – 2018-11-04 (×2): 175 ug via ORAL
  Filled 2018-11-02 (×2): qty 1

## 2018-11-02 MED ORDER — PROPOFOL 500 MG/50ML IV EMUL
INTRAVENOUS | Status: DC | PRN
  Administered 2018-11-02: 50 ug/kg/min via INTRAVENOUS

## 2018-11-02 MED ORDER — OXYCODONE HCL 5 MG PO TABS
5.0000 mg | ORAL_TABLET | ORAL | Status: DC | PRN
  Administered 2018-11-02: 5 mg via ORAL
  Administered 2018-11-02 – 2018-11-04 (×6): 10 mg via ORAL
  Filled 2018-11-02 (×9): qty 2

## 2018-11-02 MED ORDER — DIPHENHYDRAMINE HCL 50 MG/ML IJ SOLN
INTRAMUSCULAR | Status: DC | PRN
  Administered 2018-11-02: 12.5 mg via INTRAVENOUS

## 2018-11-02 MED ORDER — ACETAMINOPHEN 325 MG PO TABS
325.0000 mg | ORAL_TABLET | Freq: Four times a day (QID) | ORAL | Status: DC | PRN
  Administered 2018-11-04 (×2): 650 mg via ORAL
  Filled 2018-11-02 (×2): qty 2

## 2018-11-02 MED ORDER — ALUM & MAG HYDROXIDE-SIMETH 200-200-20 MG/5ML PO SUSP: 30.0000 mL | ORAL | Status: DC | PRN

## 2018-11-02 MED ORDER — LIDOCAINE 2% (20 MG/ML) 5 ML SYRINGE
INTRAMUSCULAR | Status: DC | PRN
  Administered 2018-11-02: 50 mg via INTRAVENOUS

## 2018-11-02 MED ORDER — STERILE WATER FOR IRRIGATION IR SOLN
Status: DC | PRN
  Administered 2018-11-02: 1000 mL

## 2018-11-02 MED ORDER — 0.9 % SODIUM CHLORIDE (POUR BTL) OPTIME
TOPICAL | Status: DC | PRN
  Administered 2018-11-02: 13:00:00 1000 mL

## 2018-11-02 MED ORDER — METOCLOPRAMIDE HCL 5 MG/ML IJ SOLN: 5.0000 mg | Freq: Three times a day (TID) | INTRAMUSCULAR | Status: DC | PRN

## 2018-11-02 MED ORDER — CEFAZOLIN SODIUM-DEXTROSE 1-4 GM/50ML-% IV SOLN
1.0000 g | Freq: Four times a day (QID) | INTRAVENOUS | Status: AC
  Administered 2018-11-02 – 2018-11-03 (×2): 1 g via INTRAVENOUS
  Filled 2018-11-02 (×2): qty 50

## 2018-11-02 MED ORDER — BUPIVACAINE IN DEXTROSE 0.75-8.25 % IT SOLN
INTRATHECAL | Status: DC | PRN
  Administered 2018-11-02: 1.6 mL via INTRATHECAL

## 2018-11-02 MED ORDER — PANTOPRAZOLE SODIUM 40 MG PO TBEC
40.0000 mg | DELAYED_RELEASE_TABLET | Freq: Every day | ORAL | Status: DC
  Administered 2018-11-02 – 2018-11-03 (×2): 40 mg via ORAL
  Filled 2018-11-02 (×2): qty 1

## 2018-11-02 MED ORDER — PROPOFOL 10 MG/ML IV BOLUS
INTRAVENOUS | Status: AC
  Filled 2018-11-02: qty 20

## 2018-11-02 MED ORDER — POLYETHYLENE GLYCOL 3350 17 G PO PACK: 17.0000 g | PACK | Freq: Every day | ORAL | Status: DC | PRN

## 2018-11-02 MED ORDER — DIPHENHYDRAMINE HCL 12.5 MG/5ML PO ELIX
12.5000 mg | ORAL_SOLUTION | ORAL | Status: DC | PRN
  Filled 2018-11-02: qty 10

## 2018-11-02 MED ORDER — FERROUS SULFATE 325 (65 FE) MG PO TABS
325.0000 mg | ORAL_TABLET | Freq: Every day | ORAL | Status: DC
  Administered 2018-11-02 – 2018-11-03 (×2): 325 mg via ORAL
  Filled 2018-11-02 (×2): qty 1

## 2018-11-02 MED ORDER — ONDANSETRON HCL 4 MG/2ML IJ SOLN: 4.0000 mg | Freq: Four times a day (QID) | INTRAMUSCULAR | Status: DC | PRN

## 2018-11-02 MED ORDER — TRANEXAMIC ACID-NACL 1000-0.7 MG/100ML-% IV SOLN
INTRAVENOUS | Status: AC
  Filled 2018-11-02: qty 100

## 2018-11-02 MED ORDER — POVIDONE-IODINE 10 % EX SWAB: 2.0000 "application " | Freq: Once | CUTANEOUS | Status: DC

## 2018-11-02 MED ORDER — ADULT MULTIVITAMIN W/MINERALS CH
1.0000 | ORAL_TABLET | Freq: Every day | ORAL | Status: DC
  Administered 2018-11-03: 1 via ORAL
  Filled 2018-11-02: qty 1

## 2018-11-02 MED ORDER — LACTATED RINGERS IV SOLN
INTRAVENOUS | Status: DC
  Administered 2018-11-02 (×2): via INTRAVENOUS

## 2018-11-02 MED ORDER — FENTANYL CITRATE (PF) 100 MCG/2ML IJ SOLN
INTRAMUSCULAR | Status: AC
  Filled 2018-11-02: qty 2

## 2018-11-02 MED ORDER — PROPOFOL 10 MG/ML IV BOLUS
INTRAVENOUS | Status: DC | PRN
  Administered 2018-11-02: 10 mg via INTRAVENOUS
  Administered 2018-11-02: 20 mg via INTRAVENOUS

## 2018-11-02 MED ORDER — METOCLOPRAMIDE HCL 5 MG PO TABS: 5.0000 mg | ORAL_TABLET | Freq: Three times a day (TID) | ORAL | Status: DC | PRN

## 2018-11-02 MED ORDER — FENTANYL CITRATE (PF) 250 MCG/5ML IJ SOLN
INTRAMUSCULAR | Status: DC | PRN
  Administered 2018-11-02: 2 ug via INTRAVENOUS

## 2018-11-02 MED ORDER — MIDAZOLAM HCL 5 MG/5ML IJ SOLN
INTRAMUSCULAR | Status: DC | PRN
  Administered 2018-11-02: 2 mg via INTRAVENOUS

## 2018-11-02 MED ORDER — TRANEXAMIC ACID-NACL 1000-0.7 MG/100ML-% IV SOLN
INTRAVENOUS | Status: DC | PRN
  Administered 2018-11-02: 1000 mg via INTRAVENOUS

## 2018-11-02 MED ORDER — OXYCODONE HCL 5 MG PO TABS
ORAL_TABLET | ORAL | Status: AC
  Filled 2018-11-02: qty 1

## 2018-11-02 MED ORDER — OXYCODONE HCL 5 MG PO TABS
10.0000 mg | ORAL_TABLET | ORAL | Status: DC | PRN
  Administered 2018-11-03 – 2018-11-04 (×4): 10 mg via ORAL
  Filled 2018-11-02: qty 2

## 2018-11-02 MED ORDER — IRBESARTAN 300 MG PO TABS
300.0000 mg | ORAL_TABLET | Freq: Every day | ORAL | Status: DC
  Administered 2018-11-02 – 2018-11-03 (×2): 300 mg via ORAL
  Filled 2018-11-02 (×3): qty 1

## 2018-11-02 MED ORDER — DOCUSATE SODIUM 100 MG PO CAPS
100.0000 mg | ORAL_CAPSULE | Freq: Two times a day (BID) | ORAL | Status: DC
  Administered 2018-11-02 – 2018-11-03 (×3): 100 mg via ORAL
  Filled 2018-11-02 (×3): qty 1

## 2018-11-02 MED ORDER — FENTANYL CITRATE (PF) 250 MCG/5ML IJ SOLN
INTRAMUSCULAR | Status: AC
  Filled 2018-11-02: qty 5

## 2018-11-02 MED ORDER — HYDROMORPHONE HCL 1 MG/ML IJ SOLN
0.5000 mg | INTRAMUSCULAR | Status: DC | PRN
  Administered 2018-11-02: 1 mg via INTRAVENOUS
  Filled 2018-11-02: qty 1

## 2018-11-02 MED ORDER — LACTATED RINGERS IV SOLN
INTRAVENOUS | Status: DC
  Administered 2018-11-02: 13:00:00 via INTRAVENOUS

## 2018-11-02 MED ORDER — CEFAZOLIN SODIUM-DEXTROSE 2-4 GM/100ML-% IV SOLN
2.0000 g | INTRAVENOUS | Status: AC
  Administered 2018-11-02: 2 g via INTRAVENOUS

## 2018-11-02 MED ORDER — METHOCARBAMOL 1000 MG/10ML IJ SOLN
500.0000 mg | Freq: Four times a day (QID) | INTRAVENOUS | Status: DC | PRN
  Filled 2018-11-02: qty 5

## 2018-11-02 MED ORDER — SODIUM CHLORIDE 0.9 % IR SOLN
Status: DC | PRN
  Administered 2018-11-02: 3000 mL

## 2018-11-02 MED ORDER — MENTHOL 3 MG MT LOZG: 1.0000 | LOZENGE | OROMUCOSAL | Status: DC | PRN

## 2018-11-02 MED ORDER — CEFAZOLIN SODIUM-DEXTROSE 2-4 GM/100ML-% IV SOLN
INTRAVENOUS | Status: AC
  Filled 2018-11-02: qty 100

## 2018-11-02 MED ORDER — ATORVASTATIN CALCIUM 10 MG PO TABS
10.0000 mg | ORAL_TABLET | Freq: Every day | ORAL | Status: DC
  Administered 2018-11-02 – 2018-11-03 (×2): 10 mg via ORAL
  Filled 2018-11-02 (×2): qty 1

## 2018-11-02 MED ORDER — DEXAMETHASONE SODIUM PHOSPHATE 10 MG/ML IJ SOLN
INTRAMUSCULAR | Status: DC | PRN
  Administered 2018-11-02: 4 mg via INTRAVENOUS

## 2018-11-02 MED ORDER — ONDANSETRON HCL 4 MG PO TABS: 4.0000 mg | ORAL_TABLET | Freq: Four times a day (QID) | ORAL | Status: DC | PRN

## 2018-11-02 MED ORDER — VITAMIN D 25 MCG (1000 UNIT) PO TABS
5000.0000 [IU] | ORAL_TABLET | Freq: Every day | ORAL | Status: DC
  Administered 2018-11-03: 5000 [IU] via ORAL
  Filled 2018-11-02: qty 5

## 2018-11-02 MED ORDER — METHOCARBAMOL 500 MG PO TABS
500.0000 mg | ORAL_TABLET | Freq: Four times a day (QID) | ORAL | Status: DC | PRN
  Administered 2018-11-02 – 2018-11-04 (×5): 500 mg via ORAL
  Filled 2018-11-02 (×5): qty 1

## 2018-11-02 MED ORDER — PHENOL 1.4 % MT LIQD: 1.0000 | OROMUCOSAL | Status: DC | PRN

## 2018-11-02 MED ORDER — IRBESARTAN-HYDROCHLOROTHIAZIDE 300-12.5 MG PO TABS: 1.0000 | ORAL_TABLET | Freq: Every day | ORAL | Status: DC

## 2018-11-02 MED ORDER — SODIUM CHLORIDE 0.9 % IV SOLN
INTRAVENOUS | Status: DC | PRN
  Administered 2018-11-02: 50 ug/min via INTRAVENOUS

## 2018-11-02 MED ORDER — SODIUM CHLORIDE 0.9 % IV SOLN
INTRAVENOUS | Status: DC
  Administered 2018-11-02: 18:00:00 via INTRAVENOUS

## 2018-11-02 MED ORDER — PHENYLEPHRINE HCL (PRESSORS) 10 MG/ML IV SOLN
INTRAVENOUS | Status: DC | PRN
  Administered 2018-11-02: 60 ug via INTRAVENOUS

## 2018-11-02 MED ORDER — MIDAZOLAM HCL 2 MG/2ML IJ SOLN
INTRAMUSCULAR | Status: AC
  Filled 2018-11-02: qty 2

## 2018-11-02 SURGICAL SUPPLY — 57 items
BENZOIN TINCTURE PRP APPL 2/3 (GAUZE/BANDAGES/DRESSINGS) ×2 IMPLANT
BLADE CLIPPER SURG (BLADE) IMPLANT
BLADE SAW SGTL 18X1.27X75 (BLADE) ×2 IMPLANT
COVER SURGICAL LIGHT HANDLE (MISCELLANEOUS) ×2 IMPLANT
COVER WAND RF STERILE (DRAPES) ×2 IMPLANT
CUP SECTOR GRIPTON 50MM (Cup) ×1 IMPLANT
DRAPE C-ARM 42X72 X-RAY (DRAPES) ×2 IMPLANT
DRAPE STERI IOBAN 125X83 (DRAPES) ×2 IMPLANT
DRAPE U-SHAPE 47X51 STRL (DRAPES) ×6 IMPLANT
DRSG AQUACEL AG ADV 3.5X10 (GAUZE/BANDAGES/DRESSINGS) ×2 IMPLANT
DRSG XEROFORM 1X8 (GAUZE/BANDAGES/DRESSINGS) ×1 IMPLANT
DURAPREP 26ML APPLICATOR (WOUND CARE) ×2 IMPLANT
ELECT BLADE 4.0 EZ CLEAN MEGAD (MISCELLANEOUS) ×2
ELECT BLADE 6.5 EXT (BLADE) IMPLANT
ELECT REM PT RETURN 9FT ADLT (ELECTROSURGICAL) ×2
ELECTRODE BLDE 4.0 EZ CLN MEGD (MISCELLANEOUS) ×1 IMPLANT
ELECTRODE REM PT RTRN 9FT ADLT (ELECTROSURGICAL) ×1 IMPLANT
FACESHIELD WRAPAROUND (MASK) ×4 IMPLANT
FACESHIELD WRAPAROUND OR TEAM (MASK) ×2 IMPLANT
GLOVE BIOGEL PI IND STRL 8 (GLOVE) ×2 IMPLANT
GLOVE BIOGEL PI INDICATOR 8 (GLOVE) ×2
GLOVE ECLIPSE 8.0 STRL XLNG CF (GLOVE) ×2 IMPLANT
GLOVE ORTHO TXT STRL SZ7.5 (GLOVE) ×4 IMPLANT
GOWN STRL REUS W/ TWL LRG LVL3 (GOWN DISPOSABLE) ×2 IMPLANT
GOWN STRL REUS W/ TWL XL LVL3 (GOWN DISPOSABLE) ×2 IMPLANT
GOWN STRL REUS W/TWL LRG LVL3 (GOWN DISPOSABLE) ×3
GOWN STRL REUS W/TWL XL LVL3 (GOWN DISPOSABLE) ×1
HANDPIECE INTERPULSE COAX TIP (DISPOSABLE) ×1
HEAD FEM STD 32X+1 STRL (Hips) ×1 IMPLANT
KIT BASIN OR (CUSTOM PROCEDURE TRAY) ×2 IMPLANT
KIT TURNOVER KIT B (KITS) ×2 IMPLANT
LINER ACET PNNCL PLUS4 NEUTRAL (Hips) IMPLANT
MANIFOLD NEPTUNE II (INSTRUMENTS) ×2 IMPLANT
NS IRRIG 1000ML POUR BTL (IV SOLUTION) ×2 IMPLANT
PACK TOTAL JOINT (CUSTOM PROCEDURE TRAY) ×2 IMPLANT
PAD ARMBOARD 7.5X6 YLW CONV (MISCELLANEOUS) ×2 IMPLANT
PENCIL BUTTON HOLSTER BLD 10FT (ELECTRODE) ×1 IMPLANT
PINNACLE PLUS 4 NEUTRAL (Hips) ×2 IMPLANT
SCREW 6.5MMX25MM (Screw) ×1 IMPLANT
SET HNDPC FAN SPRY TIP SCT (DISPOSABLE) ×1 IMPLANT
STAPLER VISISTAT 35W (STAPLE) ×1 IMPLANT
STEM CORAIL KLA12 (Stem) ×1 IMPLANT
STRIP CLOSURE SKIN 1/2X4 (GAUZE/BANDAGES/DRESSINGS) ×4 IMPLANT
SUT ETHIBOND NAB CT1 #1 30IN (SUTURE) ×2 IMPLANT
SUT MNCRL AB 4-0 PS2 18 (SUTURE) IMPLANT
SUT VIC AB 0 CT1 27 (SUTURE) ×1
SUT VIC AB 0 CT1 27XBRD ANBCTR (SUTURE) ×1 IMPLANT
SUT VIC AB 1 CT1 27 (SUTURE) ×1
SUT VIC AB 1 CT1 27XBRD ANBCTR (SUTURE) ×1 IMPLANT
SUT VIC AB 2-0 CT1 27 (SUTURE) ×2
SUT VIC AB 2-0 CT1 TAPERPNT 27 (SUTURE) ×1 IMPLANT
TOWEL GREEN STERILE (TOWEL DISPOSABLE) ×2 IMPLANT
TOWEL GREEN STERILE FF (TOWEL DISPOSABLE) ×2 IMPLANT
TRAY CATH 16FR W/PLASTIC CATH (SET/KITS/TRAYS/PACK) IMPLANT
TRAY FOLEY W/BAG SLVR 16FR (SET/KITS/TRAYS/PACK) ×1
TRAY FOLEY W/BAG SLVR 16FR ST (SET/KITS/TRAYS/PACK) IMPLANT
WATER STERILE IRR 1000ML POUR (IV SOLUTION) ×4 IMPLANT

## 2018-11-02 NOTE — Anesthesia Procedure Notes (Signed)
Procedure Name: MAC Date/Time: 11/02/2018 1:38 PM Performed by: Mariea Clonts, CRNA Pre-anesthesia Checklist: Patient identified, Suction available, Emergency Drugs available, Patient being monitored and Timeout performed Patient Re-evaluated:Patient Re-evaluated prior to induction Oxygen Delivery Method: Simple face mask and Nasal cannula

## 2018-11-02 NOTE — Brief Op Note (Signed)
11/02/2018  3:10 PM  PATIENT:  Cindy George  68 y.o. female  PRE-OPERATIVE DIAGNOSIS:  osteoarthritis and dysplasia right hip  POST-OPERATIVE DIAGNOSIS:  osteoarthritis and dysplasia right hip  PROCEDURE:  Procedure(s): RIGHT TOTAL HIP ARTHROPLASTY ANTERIOR APPROACH (Right)  SURGEON:  Surgeon(s) and Role:    Mcarthur Rossetti, MD - Primary  PHYSICIAN ASSISTANT: Benita Stabile, PA-C  ANESTHESIA:   spinal  EBL:  250 mL   COUNTS:  YES  DICTATION: .Other Dictation: Dictation Number [id number]  PLAN OF CARE: Admit to inpatient   PATIENT DISPOSITION:  PACU - hemodynamically stable.   Delay start of Pharmacological VTE agent (>24hrs) due to surgical blood loss or risk of bleeding: no

## 2018-11-02 NOTE — Op Note (Signed)
NAME: Cindy George, Cindy George MEDICAL RECORD ZW:[id number] ACCOUNT [id number] DATE OF BIRTH:1950/10/21 FACILITY: [facility] LOCATION: [facility] PHYSICIAN:Taliah Porche Kerry Fort, MD  OPERATIVE REPORT  DATE OF PROCEDURE:  11/02/2018  PREOPERATIVE DIAGNOSIS:  Primary osteoarthritis and degenerative joint disease, right hip.  POSTOPERATIVE DIAGNOSIS:  Primary osteoarthritis and degenerative joint disease, right hip.  PROCEDURE:  Right total hip arthroplasty through direct anterior approach.  IMPLANTS:  DePuy Sector Gription acetabular component size 50, 1 screw, size 36+4 neutral polyethylene liner, size 12 Corail femoral component with varus offset, size 32+1 metal hip ball.  SURGEON:  Lind Guest. Ninfa Linden, MD  ASSISTANT:  Erskine Emery, PA-C  ANESTHESIA:  Spinal.  ANTIBIOTICS:  Two grams IV Ancef.  ESTIMATED BLOOD LOSS:  250-300 mL.  COMPLICATIONS:  None.  INDICATIONS:  The patient is a very pleasant 68 year old female well known to me.  She has debilitating arthritis involving her right hip.  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  We  performed a left total hip surgery a year or two ago.  She has done very well with that left hip.  Her right hip, though has started to hurt her daily.  Her x-rays show complete loss of joint space.  Her right hip pain is detrimentally affecting her  mobility her quality of life and activities of daily living to the point she does wish to proceed with total hip arthroplasty on the right side.  We had a long and thorough discussion about this.  She understands the risk of acute blood loss anemia,  nerve and vessel injury, fracture, infection, dislocation, DVT and implant failure.  She understands our goals of decreased pain, improved mobility and overall improve quality of life.  DESCRIPTION OF PROCEDURE:  After informed consent was obtained and appropriate right hip was marked, she was  brought to the operating room, spinal anesthesia was obtained.  She was on her stretcher.  I then laid her in supine position.  Foley catheter  was placed, and traction boots were placed on both her feet.  She was next placed supine on the Hana fracture table with a perineal post in place and both legs in line skeletal traction device with no traction applied.  Her right operative hip was  prepped and draped with DuraPrep and sterile drapes.  A time-out was called and she is correct patient, correct right hip.  We then made an incision just inferior and posterior to the anterior iliac spine and carried this obliquely down the leg.  We  dissected down tensor fascia lata muscle.  Tensor fascia was then divided longitudinally to proceed with direct anterior approach to the hip.  We identified and cauterized circumflex vessels.  I then identified the hip capsule, opened the hip capsule in  an L-type format finding a significant joint effusion and significant periarticular osteophytes around the femoral head and neck.  We placed Cobra retractors around the remnants of the medial and lateral femoral neck and made our femoral neck cut with an  oscillating saw and completed this with an osteotome.  This was proximal to the lesser trochanter.  I then placed a corkscrew guide in the femoral head and removed the femoral head in its entirety and found it to be completely devoid of cartilage and  very deformed.  We then placed a bent Hohmann over the medial acetabular rim and removed remnants of the acetabular labrum and other debris.  We then began reaming  from a size 44 reamer in stepwise increments up to a size 50 with all reamers under direct  visualization, the last 2 reamers under direct fluoroscopy, so we could obtain our depth of reaming, our inclination and anteversion.  We then placed the real DePuy Sector Gription acetabular component size 50 and a single screw.  We placed a 32+4  neutral polyethylene liner  for that size acetabular component.  Attention was then turned to the femur.  With the leg externally rotated to 120 degrees, extended and adducted, we were able to place the Mueller retractor medially and Hohman retractor  behind the greater trochanter.  We released the lateral joint capsule and used a box-cutting osteotome to enter the femoral canal and a rongeur to lateralize then began broaching in stepwise increments using the Corail broaching system from a size 8 up  to a size 12.  With a size 12 in place, we tried a varus offset femoral neck based off her anatomy and a 32+1 hip ball, reduced this in the acetabulum.  We were pleased with the leg length, offset, range of motion and stability.  This was assessed  clinically and radiographically.  We then dislocated the hip and removed the trial components.  We placed the real Corail femoral component with varus offset size 12 and the real 32+1 metal hip ball and again reduced this in the acetabulum and it was  stable.  We then irrigated the soft tissue with normal saline solution using pulsatile lavage.  We once again verified the placement of the implants under direct fluoroscopy.  I was pleased with again range of motion and stability.  We then closed the  joint capsule with interrupted #1 Ethibond suture, followed by running #1 Vicryl, closed the tensor fascia, 0 Vicryl was used to close deep tissue, 2-0 Vicryl was used to close the subcutaneous tissue and interrupted staples were used to close the skin.   Xeroform and Aquacel dressing was applied.  She was taken off the table and taken to recovery room in stable condition.  All final counts were correct.  There were no complications noted.  Of note, Rexene EdisonGil Clark, PA-C, assisted in the entire case.  His  assistance was crucial for facilitating all aspects of this case.  TN/NUANCE  D:11/02/2018 T:11/02/2018 JOB:[id number]

## 2018-11-02 NOTE — Care Plan (Signed)
Ortho Bundle Case Management Note  Patient Details  Name: Cindy George MRN: [id number] Date of Birth: [birth year]                  DME Arranged:  (Patient has all DME needed (FWW, 3 in 1)) DME Agency:    HH Arranged:  PT [place] Agency:  [facility] (now [facility])   [place] spoke with Ortho Bundle patient for pre-op call. She has a daughter that will assist at home after discharge. She verbalized she has all DME needed (FWW and 3 in 1). Anticipate home health PT will be needed after short hospital stay. F/U appointment scheduled with Dr. Ninfa Linden at 2 weeks on 11/16/2018 at 945 am.  Additional Comments: Please contact me with any questions of if this plan should need to change.  Jamse Arn, RN, BSN, [employer]  [phone] 11/02/2018, 6:11 PM

## 2018-11-02 NOTE — Anesthesia Preprocedure Evaluation (Signed)
Anesthesia Evaluation  Patient identified by MRN, date of birth, ID band Patient awake    Reviewed: Allergy & Precautions, H&P , NPO status , Patient's Chart, lab work & pertinent test results  History of Anesthesia Complications (+) PONV  Airway Mallampati: III  TM Distance: >3 FB Neck ROM: Full    Dental no notable dental hx. (+) Teeth Intact, Dental Advisory Given   Pulmonary neg pulmonary ROS,    Pulmonary exam normal breath sounds clear to auscultation       Cardiovascular hypertension,  Rhythm:Regular Rate:Normal     Neuro/Psych negative neurological ROS  negative psych ROS   GI/Hepatic negative GI ROS, Neg liver ROS,   Endo/Other  Hypothyroidism   Renal/GU negative Renal ROS  negative genitourinary   Musculoskeletal  (+) Arthritis ,   Abdominal   Peds  Hematology  (+) Blood dyscrasia, anemia ,   Anesthesia Other Findings   Reproductive/Obstetrics negative OB ROS                             Anesthesia Physical Anesthesia Plan  ASA: II  Anesthesia Plan: Spinal   Post-op Pain Management:    Induction: Intravenous  PONV Risk Score and Plan: 4 or greater and Ondansetron, Dexamethasone, Propofol infusion and Midazolam  Airway Management Planned: Simple Face Mask  Additional Equipment:   Intra-op Plan:   Post-operative Plan:   Informed Consent: I have reviewed the patients History and Physical, chart, labs and discussed the procedure including the risks, benefits and alternatives for the proposed anesthesia with the patient or authorized representative who has indicated his/her understanding and acceptance.     Dental advisory given  Plan Discussed with: CRNA  Anesthesia Plan Comments:         Anesthesia Quick Evaluation

## 2018-11-02 NOTE — H&P (Signed)
TOTAL HIP ADMISSION H&P  Patient is admitted for right total hip arthroplasty.  Subjective:  Chief Complaint: right hip pain  HPI: Cindy George, 68 y.o. female, has a history of pain and functional disability in the right hip(s) due to arthritis and patient has failed non-surgical conservative treatments for greater than 12 weeks to include NSAID's and/or analgesics, corticosteriod injections, flexibility and strengthening excercises, supervised PT with diminished ADL's post treatment, use of assistive devices and activity modification.  Onset of symptoms was gradual starting 3 years ago with gradually worsening course since that time.The patient noted no past surgery on the right hip(s).  Patient currently rates pain in the right hip at 10 out of 10 with activity. Patient has night pain, worsening of pain with activity and weight bearing, trendelenberg gait, pain that interfers with activities of daily living and pain with passive range of motion. Patient has evidence of subchondral cysts, subchondral sclerosis, periarticular osteophytes and joint space narrowing by imaging studies. This condition presents safety issues increasing the risk of falls.  There is no current active infection.  Patient Active Problem List   Diagnosis Date Noted  . Unilateral primary osteoarthritis, right hip 09/29/2018  . 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 Status post left hip replacement 04/08/2016  . Osteoarthritis of right knee 05/25/2012  . Stiffness of joint, not elsewhere classified, lower leg 06/30/2011  . Swelling of joint of right knee 06/30/2011  . Difficulty in walking(719.7) 06/30/2011  . Weakness of right leg 06/30/2011  . S/P total knee replacement 06/02/2011  . ARTHRITIS, RIGHT KNEE 09/10/2009  . BUNION, LEFT FOOT 09/10/2009  . HAMMER TOE 09/10/2009  . CLOSED FRACTURE OF UPPER END OF FIBULA 07/16/2009  . HIGH BLOOD PRESSURE 07/16/2009   Past Medical  History:  Diagnosis Date  . Anemia    takes Ferrous Sulfate daily  . Arthritis   . Complication of anesthesia   . Heart murmur    as a child. No doctors have mentioned it to her.  . High blood pressure    takes Avalide daily  . High cholesterol    takes Atorvastatin daily  . History of bronchitis 2016  . History of kidney stones   . Hypothyroidism    takes Synthroid daily  . Joint pain   . Pneumonia    at age 83  . PONV (postoperative nausea and vomiting)   . Urinary urgency     Past Surgical History:  Procedure Laterality Date  . colonscopy  2010  . KIDNEY STONE SURGERY  2011  . THYROID SURGERY    . TOTAL ABDOMINAL HYSTERECTOMY    . TOTAL HIP ARTHROPLASTY Left 04/08/2016   Procedure: LEFT TOTAL HIP ARTHROPLASTY ANTERIOR APPROACH;  Surgeon: Mcarthur Rossetti, MD;  Location: [facility];  Service: Orthopedics;  Laterality: Left;  . TOTAL KNEE ARTHROPLASTY  05/19/2011   Procedure: TOTAL KNEE ARTHROPLASTY;  Surgeon: Arther Abbott, MD;  Location: [facility];  Service: Orthopedics;  Laterality: Right;  computer assisted depuy  . TUBAL LIGATION     27 yrs ago    Current Facility-Administered Medications  Medication Dose Route Frequency Provider Last Rate Last Dose  . 0.9 % irrigation (POUR BTL)    PRN Mcarthur Rossetti, MD   1,000 mL at 11/02/18 1312  . ceFAZolin (ANCEF) 2-4 GM/100ML-% IVPB           . [START ON 11/03/2018] ceFAZolin (ANCEF) IVPB 2g/100 mL premix  2 g Intravenous On  Call to OR Kirtland Bouchardlark, Gilbert W, PA-C      . chlorhexidine (HIBICLENS) 4 % liquid 4 application  60 mL Topical Once Kirtland Bouchardlark, Gilbert W, PA-C      . lactated ringers infusion   Intravenous Continuous Gaynelle AduFitzgerald, William, MD      . lactated ringers infusion   Intravenous Continuous Gaynelle AduFitzgerald, William, MD 50 mL/hr at 11/02/18 1321    . povidone-iodine 10 % swab 2 application  2 application Topical Once Kirtland Bouchardlark, Gilbert W, PA-C      . sodium chloride irrigation 0.9 %    PRN Kathryne HitchBlackman, Inger Wiest Y, MD   3,000 mL  at 11/02/18 1313   No Known Allergies  Social History   Tobacco Use  . Smoking status: Never Smoker  . Smokeless tobacco: Never Used  Substance Use Topics  . Alcohol use: No    No family history on file.   Review of Systems  Musculoskeletal: Positive for joint pain.  All other systems reviewed and are negative.   Objective:  Physical Exam  Constitutional: She is oriented to person, place, and time. She appears well-developed and well-nourished.  HENT:  Head: Normocephalic and atraumatic.  Eyes: Pupils are equal, round, and reactive to light. EOM are normal.  Neck: Normal range of motion. Neck supple.  Cardiovascular: Normal rate and regular rhythm.  Respiratory: Effort normal and breath sounds normal.  GI: Soft. Bowel sounds are normal.  Musculoskeletal:     Right hip: She exhibits decreased range of motion, decreased strength, tenderness, bony tenderness and deformity.  Neurological: She is alert and oriented to person, place, and time.  Skin: Skin is warm and dry.  Psychiatric: She has a normal mood and affect.    Vital signs in last 24 hours: Temp:  [97.4 F (36.3 C)] 97.4 F (36.3 C) (07/28 1305) Pulse Rate:  [61] 61 (07/28 1305) Resp:  [20] 20 (07/28 1305) BP: (140)/(62) 140/62 (07/28 1305) SpO2:  [97 %] 97 % (07/28 1305)  Labs:   Estimated body mass index is 33.54 kg/m as calculated from the following:   Height as of this encounter: 4\' 9"  (1.448 m).   Weight as of 10/27/18: 70.3 kg.   Imaging Review Plain radiographs demonstrate severe degenerative joint disease of the right hip(s). The bone quality appears to be good for age and reported activity level.      Assessment/Plan:  End stage arthritis, right hip(s)  The patient history, physical examination, clinical judgement of the provider and imaging studies are consistent with end stage degenerative joint disease of the right hip(s) and total hip arthroplasty is deemed medically necessary. The  treatment options including medical management, injection therapy, arthroscopy and arthroplasty were discussed at length. The risks and benefits of total hip arthroplasty were presented and reviewed. The risks due to aseptic loosening, infection, stiffness, dislocation/subluxation,  thromboembolic complications and other imponderables were discussed.  The patient acknowledged the explanation, agreed to proceed with the plan and consent was signed. Patient is being admitted for inpatient treatment for surgery, pain control, PT, OT, prophylactic antibiotics, VTE prophylaxis, progressive ambulation and ADL's and discharge planning.The patient is planning to be discharged home with home health services

## 2018-11-02 NOTE — Anesthesia Procedure Notes (Signed)
Spinal  Patient location during procedure: OR Start time: 11/02/2018 1:44 PM End time: 11/02/2018 1:47 PM Staffing Anesthesiologist: Roderic Palau, MD Performed: anesthesiologist  Preanesthetic Checklist Completed: patient identified, surgical consent, pre-op evaluation, timeout performed, IV checked, risks and benefits discussed and monitors and equipment checked Spinal Block Patient position: sitting Prep: DuraPrep Patient monitoring: cardiac monitor, continuous pulse ox and blood pressure Approach: midline Location: L3-4 Injection technique: single-shot Needle Needle type: Quincke  Needle gauge: 22 G Needle length: 9 cm Assessment Sensory level: T8 Additional Notes Functioning IV was confirmed and monitors were applied. Sterile prep and drape, including hand hygiene and sterile gloves were used. The patient was positioned and the spine was prepped. The skin was anesthetized with lidocaine.  Free flow of clear CSF was obtained prior to injecting local anesthetic into the CSF.  The spinal needle aspirated freely following injection.  The needle was carefully withdrawn.  The patient tolerated the procedure well.

## 2018-11-02 NOTE — Evaluation (Signed)
Physical Therapy Evaluation Patient Details Name: Cindy George MRN: [id number] DOB: Feb 03, 1951 Today's Date: 11/02/2018   History of Present Illness  Pt is a 68 y/o female s/p R THA, direct anterior. PMH includes L THA, and R TKA.   Clinical Impression  Pt is s/p surgery above with deficits below. Pt with increased pain, so gait distance limited to within the room. Pt requiring min to min guard A for mobility using RW. Will continue to follow acutely to maximize functional mobility independence and safety.     Follow Up Recommendations Follow surgeon's recommendation for DC plan and follow-up therapies;Supervision for mobility/OOB    Equipment Recommendations  None recommended by PT    Recommendations for Other Services       Precautions / Restrictions Precautions Precautions: None Restrictions Weight Bearing Restrictions: Yes RLE Weight Bearing: Weight bearing as tolerated      Mobility  Bed Mobility Overal bed mobility: Needs Assistance Bed Mobility: Supine to Sit     Supine to sit: Min assist     General bed mobility comments: Min A for RLE assist. Increased time required.   Transfers Overall transfer level: Needs assistance Equipment used: Rolling walker (2 wheeled) Transfers: Sit to/from Stand Sit to Stand: Min assist         General transfer comment: Min A for lift assist. Cues for safe hand placement.   Ambulation/Gait Ambulation/Gait assistance: Min guard Gait Distance (Feet): 10 Feet Assistive device: Rolling walker (2 wheeled) Gait Pattern/deviations: Step-to pattern;Decreased step length - right;Decreased step length - left;Decreased weight shift to right;Antalgic Gait velocity: Decreased   General Gait Details: Slow, antalgic gait. Distance limited to within the room secondary to pain. Cues for sequencing using RW.   Stairs            Wheelchair Mobility    Modified Rankin (Stroke Patients Only)       Balance Overall balance  assessment: Needs assistance Sitting-balance support: No upper extremity supported;Feet supported Sitting balance-Leahy Scale: Good     Standing balance support: Bilateral upper extremity supported;During functional activity Standing balance-Leahy Scale: Poor Standing balance comment: Reliant on BUE support                              Pertinent Vitals/Pain Pain Assessment: Faces Faces Pain Scale: Hurts whole lot Pain Location: R hip  Pain Descriptors / Indicators: Aching;Operative site guarding;Guarding;Grimacing Pain Intervention(s): Limited activity within patient's tolerance;Monitored during session;Repositioned;Patient requesting pain meds-RN notified;RN gave pain meds during session    Home Living Family/patient expects to be discharged to:: Private residence Living Arrangements: Children Available Help at Discharge: Family;Available 24 hours/day Type of Home: House Home Access: Stairs to enter Entrance Stairs-Rails: Psychiatric nurse of Steps: 2 Home Layout: Two level;Able to live on main level with bedroom/bathroom Home Equipment: Gilford Rile - 2 wheels;Bedside commode      Prior Function Level of Independence: Independent               Hand Dominance        Extremity/Trunk Assessment   Upper Extremity Assessment Upper Extremity Assessment: Overall WFL for tasks assessed    Lower Extremity Assessment Lower Extremity Assessment: RLE deficits/detail RLE Deficits / Details: Deficits consistent with post op pain and weakness.     Cervical / Trunk Assessment Cervical / Trunk Assessment: Normal  Communication   Communication: No difficulties  Cognition Arousal/Alertness: Awake/alert Behavior During Therapy: WFL for tasks assessed/performed Overall Cognitive  Status: Within Functional Limits for tasks assessed                                        General Comments      Exercises     Assessment/Plan    PT  Assessment Patient needs continued PT services  PT Problem List Decreased strength;Decreased range of motion;Decreased balance;Decreased activity tolerance;Decreased mobility;Decreased knowledge of use of DME;Decreased knowledge of precautions       PT Treatment Interventions DME instruction;Gait training;Functional mobility training;Stair training;Therapeutic activities;Therapeutic exercise;Balance training;Patient/family education    PT Goals (Current goals can be found in the Care Plan section)  Acute Rehab PT Goals Patient Stated Goal: to decrease pain  PT Goal Formulation: With patient Time For Goal Achievement: 11/16/18 Potential to Achieve Goals: Good    Frequency 7X/week   Barriers to discharge        Co-evaluation               AM-PAC PT "6 Clicks" Mobility  Outcome Measure Help needed turning from your back to your side while in a flat bed without using bedrails?: A Little Help needed moving from lying on your back to sitting on the side of a flat bed without using bedrails?: A Little Help needed moving to and from a bed to a chair (including a wheelchair)?: A Little Help needed standing up from a chair using your arms (e.g., wheelchair or bedside chair)?: A Little Help needed to walk in hospital room?: A Little Help needed climbing 3-5 steps with a railing? : A Lot 6 Click Score: 17    End of Session Equipment Utilized During Treatment: Gait belt Activity Tolerance: Patient limited by pain Patient left: in chair;with call bell/phone within reach Nurse Communication: Mobility status PT Visit Diagnosis: Other abnormalities of gait and mobility (R26.89);Muscle weakness (generalized) (M62.81);Pain Pain - Right/Left: Right Pain - part of body: Hip    Time: 1726-1749 PT Time Calculation (min) (ACUTE ONLY): 23 min   Charges:   PT Evaluation $PT Eval Low Complexity: 1 Low PT Treatments $Therapeutic Activity: 8-22 mins        Gladys DammeBrittany Zorah Backes, PT, DPT   Acute Rehabilitation Services  Pager: [phone] Office: [phone]   Lehman PromBrittany S Tecora Eustache 11/02/2018, 6:41 PM

## 2018-11-02 NOTE — Transfer of Care (Signed)
Immediate Anesthesia Transfer of Care Note  Patient: Cindy George  Procedure(s) Performed: RIGHT TOTAL HIP ARTHROPLASTY ANTERIOR APPROACH (Right Hip)  Patient Location: PACU  Anesthesia Type:Spinal  Level of Consciousness: awake, alert  and oriented  Airway & Oxygen Therapy: Patient Spontanous Breathing and Patient connected to nasal cannula oxygen  Post-op Assessment: Report given to RN and Post -op Vital signs reviewed and stable  Post vital signs: Reviewed and stable  Last Vitals:  Vitals Value Taken Time  BP 135/117 11/02/18 1527  Temp    Pulse 84 11/02/18 1532  Resp 20 11/02/18 1532  SpO2 100 % 11/02/18 1532  Vitals shown include unvalidated device data.  Last Pain:  Vitals:   11/02/18 1305  TempSrc: Oral         Complications: No apparent anesthesia complications

## 2018-11-02 NOTE — Anesthesia Postprocedure Evaluation (Signed)
Anesthesia Post Note  Patient: Cindy George  Procedure(s) Performed: RIGHT TOTAL HIP ARTHROPLASTY ANTERIOR APPROACH (Right Hip)     Patient location during evaluation: PACU Anesthesia Type: Spinal Level of consciousness: oriented and awake and alert Pain management: pain level controlled Vital Signs Assessment: post-procedure vital signs reviewed and stable Respiratory status: spontaneous breathing, respiratory function stable and patient connected to nasal cannula oxygen Cardiovascular status: blood pressure returned to baseline and stable Postop Assessment: no headache, no backache, no apparent nausea or vomiting, spinal receding and patient able to bend at knees Anesthetic complications: no    Last Vitals:  Vitals:   11/02/18 1610 11/02/18 1625  BP: 114/66 120/74  Pulse: 72 76  Resp: 15 19  Temp:  (!) 36.3 C  SpO2: 98% 96%    Last Pain:  Vitals:   11/02/18 1625  TempSrc:   PainSc: 4                  Sianne Tejada,W. EDMOND

## 2018-11-03 ENCOUNTER — Encounter (HOSPITAL_COMMUNITY): Payer: Self-pay | Admitting: Orthopaedic Surgery

## 2018-11-03 LAB — BASIC METABOLIC PANEL
Anion gap: 10 (ref 5–15)
BUN: 14 mg/dL (ref 8–23)
CO2: 25 mmol/L (ref 22–32)
Calcium: 8.9 mg/dL (ref 8.9–10.3)
Chloride: 103 mmol/L (ref 98–111)
Creatinine, Ser: 0.7 mg/dL (ref 0.44–1.00)
GFR calc Af Amer: >60 mL/min (ref >60)
GFR calc non Af Amer: >60 mL/min (ref >60)
Glucose, Bld: 148 mg/dL — ABNORMAL HIGH (ref 70–99)
Potassium: 4.3 mmol/L (ref 3.5–5.1)
Sodium: 138 mmol/L (ref 135–145)

## 2018-11-03 LAB — CBC
HCT: 28.2 % — ABNORMAL LOW (ref 36.0–46.0)
Hemoglobin: 9.7 g/dL — ABNORMAL LOW (ref 12.0–15.0)
MCH: 31 pg (ref 26.0–34.0)
MCHC: 34.4 g/dL (ref 30.0–36.0)
MCV: 90.1 fL (ref 80.0–100.0)
Platelets: 162 10*3/uL (ref 150–400)
RBC: 3.13 MIL/uL — ABNORMAL LOW (ref 3.87–5.11)
RDW: 12 % (ref 11.5–15.5)
WBC: 7.6 10*3/uL (ref 4.0–10.5)
nRBC: 0 % (ref 0.0–0.2)

## 2018-11-03 MED ORDER — METHOCARBAMOL 500 MG PO TABS: 500.0000 mg | ORAL_TABLET | Freq: Four times a day (QID) | ORAL | 1 refills | Status: DC | PRN

## 2018-11-03 MED ORDER — OXYCODONE HCL 5 MG PO TABS: 5.0000 mg | ORAL_TABLET | Freq: Four times a day (QID) | ORAL | 0 refills | Status: DC | PRN

## 2018-11-03 MED ORDER — ASPIRIN 81 MG PO CHEW: 81.0000 mg | CHEWABLE_TABLET | Freq: Two times a day (BID) | ORAL | 0 refills | Status: DC

## 2018-11-03 NOTE — Progress Notes (Signed)
Physical Therapy Treatment Patient Details Name: Cindy George MRN: [id number] DOB: [birth year] Today's Date: 11/03/2018    History of Present Illness Pt is a 68 y/o female s/p R THA, direct anterior. PMH includes L THA, and R TKA.     PT Comments    Pt reports feeling a little more stiff and sore this afternoon, however mobility appears improved. Pt with an overall good rehab effort this afternoon, and feel she will be safe for d/c after single session with PT for stair training in the morning. She was educated on HEP, car transfer, verbal education for stair negotiation, and sequencing during gait training. Will continue to follow.     Follow Up Recommendations  Follow surgeon's recommendation for DC plan and follow-up therapies;Supervision for mobility/OOB     Equipment Recommendations  None recommended by PT    Recommendations for Other Services       Precautions / Restrictions Precautions Precautions: None Restrictions Weight Bearing Restrictions: No RLE Weight Bearing: Weight bearing as tolerated    Mobility  Bed Mobility Overal bed mobility: Needs Assistance Bed Mobility: Supine to Sit     Supine to sit: Min guard     General bed mobility comments: Pt was able to utilize strong LLE to move RLE towards EOB. Increased time and effort to get to EOB without assistance however was able to complete with cues for technique.   Transfers Overall transfer level: Needs assistance Equipment used: Rolling walker (2 wheeled) Transfers: Sit to/from Stand Sit to Stand: Min guard         General transfer comment: Hands-on guarding for power-up to full stand. Cues for safe hand placement.   Ambulation/Gait Ambulation/Gait assistance: Min guard Gait Distance (Feet): 250 Feet Assistive device: Rolling walker (2 wheeled) Gait Pattern/deviations: Step-to pattern;Decreased step length - right;Decreased step length - left;Decreased weight shift to right;Antalgic Gait  velocity: Decreased Gait velocity interpretation: <1.8 ft/sec, indicate of risk for recurrent falls General Gait Details: Slow, antalgic gait but improved by end of gait training.    Stairs             Wheelchair Mobility    Modified Rankin (Stroke Patients Only)       Balance Overall balance assessment: Needs assistance Sitting-balance support: No upper extremity supported;Feet supported Sitting balance-Leahy Scale: Good     Standing balance support: Bilateral upper extremity supported;During functional activity Standing balance-Leahy Scale: Fair Standing balance comment: Statically able to stand without assist.                             Cognition Arousal/Alertness: Awake/alert Behavior During Therapy: WFL for tasks assessed/performed Overall Cognitive Status: Within Functional Limits for tasks assessed                                        Exercises Total Joint Exercises Ankle Circles/Pumps: 10 reps Quad Sets: 10 reps Towel Squeeze: 10 reps Short Arc Quad: 10 reps Heel Slides: 10 reps Hip ABduction/ADduction: 10 reps Long Arc Quad: 10 reps    General Comments        Pertinent Vitals/Pain Pain Assessment: Faces Faces Pain Scale: Hurts little more Pain Location: R hip  Pain Descriptors / Indicators: Aching;Operative site guarding;Guarding;Grimacing Pain Intervention(s): Monitored during session    Home Living  Prior Function            PT Goals (current goals can now be found in the care plan section) Acute Rehab PT Goals Patient Stated Goal: to decrease pain  PT Goal Formulation: With patient Time For Goal Achievement: 11/16/18 Potential to Achieve Goals: Good Progress towards PT goals: Progressing toward goals    Frequency    7X/week      PT Plan Current plan remains appropriate    Co-evaluation              AM-PAC PT "6 Clicks" Mobility   Outcome Measure  Help  needed turning from your back to your side while in a flat bed without using bedrails?: A Little Help needed moving from lying on your back to sitting on the side of a flat bed without using bedrails?: A Little Help needed moving to and from a bed to a chair (including a wheelchair)?: A Little Help needed standing up from a chair using your arms (e.g., wheelchair or bedside chair)?: A Little Help needed to walk in hospital room?: A Little Help needed climbing 3-5 steps with a railing? : A Lot 6 Click Score: 17    End of Session Equipment Utilized During Treatment: Gait belt Activity Tolerance: Patient limited by pain Patient left: in bed;with call bell/phone within reach Nurse Communication: Mobility status PT Visit Diagnosis: Other abnormalities of gait and mobility (R26.89);Muscle weakness (generalized) (M62.81);Pain Pain - Right/Left: Right Pain - part of body: Hip     Time: 4315-4008 PT Time Calculation (min) (ACUTE ONLY): 31 min  Charges:  $Gait Training: 8-22 mins $Therapeutic Exercise: 8-22 mins                     Rolinda Roan, PT, DPT Acute Rehabilitation Services Pager: [phone] Office: [phone]    Thelma Comp 11/03/2018, 3:24 PM

## 2018-11-03 NOTE — Discharge Instructions (Signed)

## 2018-11-03 NOTE — Progress Notes (Signed)
Physical Therapy Treatment Patient Details Name: Cindy George MRN: [id number] DOB: 01/23/1951 Today's Date: 11/03/2018    History of Present Illness Pt is a 68 y/o female s/p R THA, direct anterior. PMH includes L THA, and R TKA.     PT Comments    Pt progressing towards physical therapy goals. Was able to perform transfers and ambulation with gross min guard assist for balance support and safety. Pt moving very slow initially however progressed by end of session. THR exercises initiated this session. Will continue to follow.     Follow Up Recommendations  Follow surgeon's recommendation for DC plan and follow-up therapies;Supervision for mobility/OOB     Equipment Recommendations  None recommended by PT    Recommendations for Other Services       Precautions / Restrictions Precautions Precautions: None Restrictions Weight Bearing Restrictions: No RLE Weight Bearing: Weight bearing as tolerated    Mobility  Bed Mobility               General bed mobility comments: Pt received sitting up in recliner chair.   Transfers Overall transfer level: Needs assistance Equipment used: Rolling walker (2 wheeled) Transfers: Sit to/from Stand Sit to Stand: Min guard         General transfer comment: Hands-on guarding for power-up to full stand. Cues for safe hand placement.   Ambulation/Gait Ambulation/Gait assistance: Min guard Gait Distance (Feet): 200 Feet Assistive device: Rolling walker (2 wheeled) Gait Pattern/deviations: Step-to pattern;Decreased step length - right;Decreased step length - left;Decreased weight shift to right;Antalgic Gait velocity: Decreased Gait velocity interpretation: <1.8 ft/sec, indicate of risk for recurrent falls General Gait Details: Slow, antalgic gait but improved by end of gait training.    Stairs             Wheelchair Mobility    Modified Rankin (Stroke Patients Only)       Balance Overall balance assessment:  Needs assistance Sitting-balance support: No upper extremity supported;Feet supported Sitting balance-Leahy Scale: Good     Standing balance support: Bilateral upper extremity supported;During functional activity Standing balance-Leahy Scale: Poor Standing balance comment: Reliant on BUE support                             Cognition Arousal/Alertness: Awake/alert Behavior During Therapy: WFL for tasks assessed/performed Overall Cognitive Status: Within Functional Limits for tasks assessed                                        Exercises Total Joint Exercises Ankle Circles/Pumps: 10 reps Quad Sets: 10 reps Towel Squeeze: 10 reps Hip ABduction/ADduction: 5 reps Long Arc Quad: 10 reps    General Comments        Pertinent Vitals/Pain Pain Assessment: Faces Faces Pain Scale: Hurts little more Pain Location: R hip  Pain Descriptors / Indicators: Aching;Operative site guarding;Guarding;Grimacing Pain Intervention(s): Monitored during session    Home Living                      Prior Function            PT Goals (current goals can now be found in the care plan section) Acute Rehab PT Goals Patient Stated Goal: to decrease pain  PT Goal Formulation: With patient Time For Goal Achievement: 11/16/18 Potential to Achieve Goals: Good Progress towards PT goals:  Progressing toward goals    Frequency    7X/week      PT Plan Current plan remains appropriate    Co-evaluation              AM-PAC PT "6 Clicks" Mobility   Outcome Measure  Help needed turning from your back to your side while in a flat bed without using bedrails?: A Little Help needed moving from lying on your back to sitting on the side of a flat bed without using bedrails?: A Little Help needed moving to and from a bed to a chair (including a wheelchair)?: A Little Help needed standing up from a chair using your arms (e.g., wheelchair or bedside chair)?: A  Little Help needed to walk in hospital room?: A Little Help needed climbing 3-5 steps with a railing? : A Lot 6 Click Score: 17    End of Session Equipment Utilized During Treatment: Gait belt Activity Tolerance: Patient limited by pain Patient left: in chair;with call bell/phone within reach Nurse Communication: Mobility status PT Visit Diagnosis: Other abnormalities of gait and mobility (R26.89);Muscle weakness (generalized) (M62.81);Pain Pain - Right/Left: Right Pain - part of body: Hip     Time: [id number] PT Time Calculation (min) (ACUTE ONLY): 36 min  Charges:  $Gait Training: 8-22 mins $Therapeutic Exercise: 8-22 mins                     Conni SlipperLaura Kelso George, PT, DPT Acute Rehabilitation Services Pager: [phone] Office: [phone]    Cindy PearsonLaura D Anjana George 11/03/2018, 3:20 PM

## 2018-11-03 NOTE — Progress Notes (Signed)
Patient ID: Cindy George, female   DOB: Dec 26, 1950, 68 y.o.   MRN: [id number] She feels good overall.  Has been up with therapy.  They are coming back this afternoon to work with her.  She needs to work on steps/stairs.  She does live alone.  I am more comfortable with discharge for tomorrow. The patient agrees as well.

## 2018-11-03 NOTE — Progress Notes (Signed)
Subjective: 1 Day Post-Op Procedure(s) (LRB): RIGHT TOTAL HIP ARTHROPLASTY ANTERIOR APPROACH (Right) Patient reports pain as moderate.  Acute blood loss anemia from surgery, but well-tolerated.  Objective: Vital signs in last 24 hours: Temp:  [97.3 F (36.3 C)-98.2 F (36.8 C)] 98.2 F (36.8 C) (07/29 0730) Pulse Rate:  [61-90] 76 (07/29 0730) Resp:  [13-20] 18 (07/29 0730) BP: (100-140)/(56-91) 118/63 (07/29 0730) SpO2:  [95 %-100 %] 100 % (07/29 0730)  Intake/Output from previous day: 07/28 0701 - 07/29 0700 In: 1610 [P.O.:120; I.V.:1075] Out: 2325 [Urine:2075; Blood:250] Intake/Output this shift: No intake/output data recorded.  Recent Labs    11/03/18 0428  HGB 9.7*   Recent Labs    11/03/18 0428  WBC 7.6  RBC 3.13*  HCT 28.2*  PLT 162   Recent Labs    11/03/18 0428  NA 138  K 4.3  CL 103  CO2 25  BUN 14  CREATININE 0.70  GLUCOSE 148*  CALCIUM 8.9   No results for input(s): LABPT, INR in the last 72 hours.  Sensation intact distally Intact pulses distally Dorsiflexion/Plantar flexion intact Incision: dressing C/D/I   Assessment/Plan: 1 Day Post-Op Procedure(s) (LRB): RIGHT TOTAL HIP ARTHROPLASTY ANTERIOR APPROACH (Right) Up with therapy Discharge home with home health later today vs tomorrow.      Mcarthur Rossetti 11/03/2018, 7:42 AM

## 2018-11-04 NOTE — Discharge Summary (Signed)
Patient ID: Cindy George MRN: [id number] DOB/AGE: Feb 21, 1951 68 y.o.  Admit date: 11/02/2018 Discharge date: 11/04/2018  Admission Diagnoses:  Principal Problem:   Unilateral primary osteoarthritis, right hip Active Problems:   Status post total replacement of right hip   Discharge Diagnoses:  Same  Past Medical History:  Diagnosis Date  . Anemia    takes Ferrous Sulfate daily  . Arthritis   . Complication of anesthesia   . Heart murmur    as a child. No doctors have mentioned it to her.  . High blood pressure    takes Avalide daily  . High cholesterol    takes Atorvastatin daily  . History of bronchitis 2016  . History of kidney stones   . Hypothyroidism    takes Synthroid daily  . Joint pain   . Pneumonia    at age 16  . PONV (postoperative nausea and vomiting)   . Urinary urgency     Surgeries: Procedure(s): RIGHT TOTAL HIP ARTHROPLASTY ANTERIOR APPROACH on 11/02/2018   Consultants:   Discharged Condition: Improved  Hospital Course: Cindy George is an 68 y.o. female who was admitted 11/02/2018 for operative treatment ofUnilateral primary osteoarthritis, right hip. Patient has severe unremitting pain that affects sleep, daily activities, and work/hobbies. After pre-op clearance the patient was taken to the operating room on 11/02/2018 and underwent  Procedure(s): RIGHT TOTAL HIP ARTHROPLASTY ANTERIOR APPROACH.    Patient was given perioperative antibiotics:  Anti-infectives (From admission, onward)   Start     Dose/Rate Route Frequency Ordered Stop   11/03/18 0600  ceFAZolin (ANCEF) IVPB 2g/100 mL premix     2 g 200 mL/hr over 30 Minutes Intravenous On call to O.R. 11/02/18 1243 11/02/18 1423   11/02/18 2000  ceFAZolin (ANCEF) IVPB 1 g/50 mL premix     1 g 100 mL/hr over 30 Minutes Intravenous Every 6 hours 11/02/18 1648 11/03/18 0146   11/02/18 1247  ceFAZolin (ANCEF) 2-4 GM/100ML-% IVPB    Note to Pharmacy: Merryl Hacker   : cabinet override       11/02/18 1247 11/02/18 1353       Patient was given sequential compression devices, early ambulation, and chemoprophylaxis to prevent DVT.  Patient benefited maximally from hospital stay and there were no complications.    Recent vital signs:  Patient Vitals for the past 24 hrs:  BP Temp Temp src Pulse Resp SpO2  11/04/18 0417 (!) 111/58 98.6 F (37 C) Oral 89 - 98 %  11/03/18 2311 (!) 116/56 99.6 F (37.6 C) Oral (!) 112 - 100 %  11/03/18 1944 (!) 109/51 97.6 F (36.4 C) Oral 86 - 98 %  11/03/18 1636 91/69 98.2 F (36.8 C) Oral 96 19 100 %  11/03/18 1132 (!) 106/49 98.3 F (36.8 C) Oral 82 18 100 %  11/03/18 0730 118/63 98.2 F (36.8 C) Oral 76 18 100 %     Recent laboratory studies:  Recent Labs    11/03/18 0428  WBC 7.6  HGB 9.7*  HCT 28.2*  PLT 162  NA 138  K 4.3  CL 103  CO2 25  BUN 14  CREATININE 0.70  GLUCOSE 148*  CALCIUM 8.9     Discharge Medications:   Allergies as of 11/04/2018   No Known Allergies     Medication List    STOP taking these medications   acetaminophen-codeine 300-30 MG tablet Commonly known as: TYLENOL #3   aspirin EC 81 MG tablet Replaced by: aspirin 81  MG chewable tablet     TAKE these medications   aspirin 81 MG chewable tablet Chew 1 tablet (81 mg total) by mouth 2 (two) times daily. Replaces: aspirin EC 81 MG tablet   atorvastatin 10 MG tablet Commonly known as: LIPITOR Take 10 mg by mouth daily.   calcium carbonate 1500 (600 Ca) MG Tabs tablet Commonly known as: OSCAL Take 600 mg of elemental calcium by mouth once a week.   Dialyvite Vitamin D 5000 125 MCG (5000 UT) capsule Generic drug: Cholecalciferol Take 10,000 Units by mouth daily.   diphenhydrAMINE 25 MG tablet Commonly known as: BENADRYL Take 25 mg by mouth daily as needed for allergies.   ibuprofen 200 MG tablet Commonly known as: ADVIL Take 600 mg by mouth daily as needed for moderate pain.   irbesartan-hydrochlorothiazide 300-12.5 MG  tablet Commonly known as: AVALIDE Take 1 tablet by mouth daily.   Iron 325 (65 Fe) MG Tabs Take 1 tablet by mouth daily.   levothyroxine 175 MCG tablet Commonly known as: SYNTHROID Take 175 mcg by mouth daily.   methocarbamol 500 MG tablet Commonly known as: ROBAXIN Take 1 tablet (500 mg total) by mouth every 6 (six) hours as needed for muscle spasms.   multivitamin with minerals Tabs tablet Take 1 tablet by mouth daily.   oxyCODONE 5 MG immediate release tablet Commonly known as: Oxy IR/ROXICODONE Take 1 tablet (5 mg total) by mouth every 6 (six) hours as needed for moderate pain (pain score 4-6).   REFRESH OP Place 1 drop into both eyes daily as needed (dry eyes).            Durable Medical Equipment  (From admission, onward)         Start     Ordered   11/02/18 1649  DME 3 n 1  Once     11/02/18 1648   11/02/18 1649  DME Walker rolling  Once    Question:  Patient needs a walker to treat with the following condition  Answer:  Status post total replacement of right hip   11/02/18 1648          Diagnostic Studies: Dg Pelvis Portable  Result Date: 11/02/2018 CLINICAL DATA:  Status post right total hip arthroplasty EXAM: PORTABLE PELVIS 1-2 VIEWS COMPARISON:  April 08, 2016 FINDINGS: The patient is status post interval right total hip arthroplasty. No periprosthetic fracture or dislocation is identified. Overlying surgical staples and subcutaneous emphysema is seen. Again noted is a left total hip arthroplasty. Diffuse osteopenia is seen. IMPRESSION: Interval right total hip arthroplasty without hardware complication. Electronically Signed   By: Jonna ClarkBindu  Avutu M.D.   On: 11/02/2018 17:49   Dg C-arm 1-60 Min  Result Date: 11/02/2018 CLINICAL DATA:  Right hip arthroplasty EXAM: DG C-ARM 61-120 MIN; OPERATIVE RIGHT HIP WITH PELVIS COMPARISON:  None FLUOROSCOPY TIME:  34 second FINDINGS: Interval right total hip arthroplasty without failure or complication. Normal  alignment. IMPRESSION: Interval right total hip arthroplasty. Electronically Signed   By: Elige KoHetal  Patel   On: 11/02/2018 16:51   Dg Hip Operative Unilat W Or W/o Pelvis Right  Result Date: 11/02/2018 CLINICAL DATA:  Right hip arthroplasty EXAM: DG C-ARM 61-120 MIN; OPERATIVE RIGHT HIP WITH PELVIS COMPARISON:  None FLUOROSCOPY TIME:  34 second FINDINGS: Interval right total hip arthroplasty without failure or complication. Normal alignment. IMPRESSION: Interval right total hip arthroplasty. Electronically Signed   By: Elige KoHetal  Patel   On: 11/02/2018 16:51    Disposition: Discharge disposition:  01-Home or Self Care         Follow-up Information    Kathryne HitchBlackman, Nyko Gell Y, MD. Go on 11/16/2018.   Specialty: Orthopedic Surgery Why: at 9:45 am for your 2 week initial post-op appointment with Dr. Magnus IvanBlackman. Contact information: [address] [place] [address] [phone]        Home, [facility] Go on 11/04/2018.   Specialty: Home Health Services Why: You will be scheduled for 5 Home health physical therapy visits; Someone from the Home Health agency will be in touch once you are discharged home to schedule your initial evaluation. Contact information: [address] [place] [address] [phone]            Signed: Kathryne HitchChristopher Y Nephtali Docken 11/04/2018, 6:55 AM

## 2018-11-04 NOTE — Progress Notes (Signed)
Patient ID: Cindy George, female   DOB: [birth year], 68 y.o.   MRN: [id number] No acute changes.  Doing well overall.  Hip stable.  Can be discharged to home today.

## 2018-11-04 NOTE — Progress Notes (Signed)
Patient alert and oriented, mae's well, voiding adequate amount of urine, swallowing without difficulty, no c/o pain at time of discharge. Patient discharged home with family. Script and discharged instructions given to patient. Patient and family stated understanding of instructions given. Patient has an appointment with Dr. Blackman   

## 2018-11-04 NOTE — Progress Notes (Signed)
Physical Therapy Treatment Patient Details Name: Cindy MunsonMargaret H Panzer MRN: [id number] DOB: [birth year] Today's Date: 11/04/2018    History of Present Illness Pt is a 68 y/o female s/p R THA, direct anterior. PMH includes L THA, and R TKA.     PT Comments    Pt making steady progress with functional mobility. She tolerated stair training well this session without difficulties. PT reviewed car transfers and generalized walking program with pt throughout. Pt is ready to d/c home with daughter from a PT perspective. Pt would continue to benefit from skilled physical therapy services at this time while admitted and after d/c to address the below listed limitations in order to improve overall safety and independence with functional mobility.   Follow Up Recommendations  Home health PT;Supervision for mobility/OOB     Equipment Recommendations  None recommended by PT    Recommendations for Other Services       Precautions / Restrictions Precautions Precautions: None Restrictions Weight Bearing Restrictions: Yes RLE Weight Bearing: Weight bearing as tolerated    Mobility  Bed Mobility Overal bed mobility: Needs Assistance Bed Mobility: Supine to Sit     Supine to sit: Supervision     General bed mobility comments: supervision for safety; pt able to use L LE to assist R LE off of bed  Transfers Overall transfer level: Needs assistance Equipment used: Rolling walker (2 wheeled) Transfers: Sit to/from Stand Sit to Stand: Supervision         General transfer comment: supervision for safety, good technique utilized, no instability or LOB  Ambulation/Gait Ambulation/Gait assistance: Min guard Gait Distance (Feet): 400 Feet Assistive device: Rolling walker (2 wheeled) Gait Pattern/deviations: Step-to pattern;Step-through pattern;Decreased step length - right;Decreased step length - left;Decreased stride length;Decreased weight shift to right Gait velocity: Decreased    General Gait Details: pt initially using a step-to pattern and then progressing to more of a step-through pattern; no instability or LOB, good use of RW   Stairs Stairs: Yes Stairs assistance: Min guard Stair Management: One rail Left;Step to pattern;Forwards Number of Stairs: 2 General stair comments: practiced twice; pt using unilateral handrail and 1HHA to simulate her home set up with two hand rails. Pt with no instability or LOB, no difficulties completing stair training   Wheelchair Mobility    Modified Rankin (Stroke Patients Only)       Balance Overall balance assessment: Needs assistance Sitting-balance support: No upper extremity supported;Feet supported Sitting balance-Leahy Scale: Good     Standing balance support: During functional activity Standing balance-Leahy Scale: Fair                              Cognition Arousal/Alertness: Awake/alert Behavior During Therapy: WFL for tasks assessed/performed Overall Cognitive Status: Within Functional Limits for tasks assessed                                        Exercises      General Comments        Pertinent Vitals/Pain Pain Assessment: Faces Faces Pain Scale: Hurts little more Pain Location: R hip  Pain Descriptors / Indicators: Aching;Operative site guarding;Guarding;Grimacing Pain Intervention(s): Monitored during session;Repositioned    Home Living                      Prior Function  PT Goals (current goals can now be found in the care plan section) Acute Rehab PT Goals PT Goal Formulation: With patient Time For Goal Achievement: 11/16/18 Potential to Achieve Goals: Good Progress towards PT goals: Progressing toward goals    Frequency    7X/week      PT Plan Current plan remains appropriate    Co-evaluation              AM-PAC PT "6 Clicks" Mobility   Outcome Measure  Help needed turning from your back to your side while  in a flat bed without using bedrails?: None Help needed moving from lying on your back to sitting on the side of a flat bed without using bedrails?: None Help needed moving to and from a bed to a chair (including a wheelchair)?: A Little Help needed standing up from a chair using your arms (e.g., wheelchair or bedside chair)?: A Little Help needed to walk in hospital room?: A Little Help needed climbing 3-5 steps with a railing? : A Little 6 Click Score: 20    End of Session Equipment Utilized During Treatment: Gait belt Activity Tolerance: Patient tolerated treatment well Patient left: in bed;with call bell/phone within reach(sitting EOB) Nurse Communication: Mobility status PT Visit Diagnosis: Other abnormalities of gait and mobility (R26.89);Muscle weakness (generalized) (M62.81);Pain Pain - Right/Left: Right Pain - part of body: Hip     Time: 8295-6213 PT Time Calculation (min) (ACUTE ONLY): 17 min  Charges:  $Gait Training: 8-22 mins                     Sherie Don, [place], DPT  Acute Rehabilitation Services Pager [phone] Office [place] 11/04/2018, 9:48 AM

## 2018-11-05 ENCOUNTER — Telehealth: Payer: Self-pay | Admitting: *Deleted

## 2018-11-05 NOTE — Care Plan (Signed)
RNCM called and checked on patient after discharge from hospital yesterday, 11/04/18 s/p R-total hip replacement on 11/02/18. She verbalized she is doing well at home. Having some swelling around the hip area, but overall feels she is doing ok. HHPT has been in contact and she is waiting for a call back to set up her initial visit. Reminded of appointment and how to reach [facility] for any further needs.

## 2018-11-05 NOTE — Telephone Encounter (Signed)
Ortho bundle discharge call completed. 

## 2018-11-06 DIAGNOSIS — Z96643 Presence of artificial hip joint, bilateral: Secondary | ICD-10-CM | POA: Diagnosis not present

## 2018-11-06 DIAGNOSIS — D649 Anemia, unspecified: Secondary | ICD-10-CM | POA: Diagnosis not present

## 2018-11-06 DIAGNOSIS — I10 Essential (primary) hypertension: Secondary | ICD-10-CM | POA: Diagnosis not present

## 2018-11-06 DIAGNOSIS — E039 Hypothyroidism, unspecified: Secondary | ICD-10-CM | POA: Diagnosis not present

## 2018-11-06 DIAGNOSIS — Z471 Aftercare following joint replacement surgery: Secondary | ICD-10-CM | POA: Diagnosis not present

## 2018-11-06 DIAGNOSIS — Z96651 Presence of right artificial knee joint: Secondary | ICD-10-CM | POA: Diagnosis not present

## 2018-11-06 DIAGNOSIS — M21619 Bunion of unspecified foot: Secondary | ICD-10-CM | POA: Diagnosis not present

## 2018-11-06 DIAGNOSIS — E785 Hyperlipidemia, unspecified: Secondary | ICD-10-CM | POA: Diagnosis not present

## 2018-11-08 DIAGNOSIS — M21619 Bunion of unspecified foot: Secondary | ICD-10-CM | POA: Diagnosis not present

## 2018-11-08 DIAGNOSIS — E039 Hypothyroidism, unspecified: Secondary | ICD-10-CM | POA: Diagnosis not present

## 2018-11-08 DIAGNOSIS — I10 Essential (primary) hypertension: Secondary | ICD-10-CM | POA: Diagnosis not present

## 2018-11-08 DIAGNOSIS — Z471 Aftercare following joint replacement surgery: Secondary | ICD-10-CM | POA: Diagnosis not present

## 2018-11-08 DIAGNOSIS — D649 Anemia, unspecified: Secondary | ICD-10-CM | POA: Diagnosis not present

## 2018-11-08 DIAGNOSIS — E785 Hyperlipidemia, unspecified: Secondary | ICD-10-CM | POA: Diagnosis not present

## 2018-11-09 DIAGNOSIS — Z471 Aftercare following joint replacement surgery: Secondary | ICD-10-CM | POA: Diagnosis not present

## 2018-11-09 DIAGNOSIS — E785 Hyperlipidemia, unspecified: Secondary | ICD-10-CM | POA: Diagnosis not present

## 2018-11-09 DIAGNOSIS — I10 Essential (primary) hypertension: Secondary | ICD-10-CM | POA: Diagnosis not present

## 2018-11-09 DIAGNOSIS — E039 Hypothyroidism, unspecified: Secondary | ICD-10-CM | POA: Diagnosis not present

## 2018-11-09 DIAGNOSIS — M21619 Bunion of unspecified foot: Secondary | ICD-10-CM | POA: Diagnosis not present

## 2018-11-09 DIAGNOSIS — D649 Anemia, unspecified: Secondary | ICD-10-CM | POA: Diagnosis not present

## 2018-11-10 ENCOUNTER — Telehealth: Payer: Self-pay | Admitting: *Deleted

## 2018-11-10 NOTE — Telephone Encounter (Signed)
1 week Ortho Bundle call completed. 

## 2018-11-11 ENCOUNTER — Other Ambulatory Visit: Payer: Self-pay | Admitting: Orthopedic Surgery

## 2018-11-11 ENCOUNTER — Telehealth: Payer: Self-pay | Admitting: Orthopaedic Surgery

## 2018-11-11 DIAGNOSIS — M21619 Bunion of unspecified foot: Secondary | ICD-10-CM | POA: Diagnosis not present

## 2018-11-11 DIAGNOSIS — I10 Essential (primary) hypertension: Secondary | ICD-10-CM | POA: Diagnosis not present

## 2018-11-11 DIAGNOSIS — Z471 Aftercare following joint replacement surgery: Secondary | ICD-10-CM | POA: Diagnosis not present

## 2018-11-11 DIAGNOSIS — E785 Hyperlipidemia, unspecified: Secondary | ICD-10-CM | POA: Diagnosis not present

## 2018-11-11 DIAGNOSIS — E039 Hypothyroidism, unspecified: Secondary | ICD-10-CM | POA: Diagnosis not present

## 2018-11-11 DIAGNOSIS — D649 Anemia, unspecified: Secondary | ICD-10-CM | POA: Diagnosis not present

## 2018-11-11 MED ORDER — OXYCODONE HCL 5 MG PO TABS: 5.0000 mg | ORAL_TABLET | Freq: Four times a day (QID) | ORAL | 0 refills | Status: DC | PRN

## 2018-11-11 NOTE — Telephone Encounter (Signed)
Can ou advise for [facility] please patient had a  Right Hip replacement7/28/20

## 2018-11-11 NOTE — Telephone Encounter (Signed)
rx sent

## 2018-11-11 NOTE — Telephone Encounter (Signed)
Patient called left voicemail message needing Rx refilled (Oxycodone) The number to contact patient is [phone]

## 2018-11-15 DIAGNOSIS — M21619 Bunion of unspecified foot: Secondary | ICD-10-CM | POA: Diagnosis not present

## 2018-11-15 DIAGNOSIS — D649 Anemia, unspecified: Secondary | ICD-10-CM | POA: Diagnosis not present

## 2018-11-15 DIAGNOSIS — E039 Hypothyroidism, unspecified: Secondary | ICD-10-CM | POA: Diagnosis not present

## 2018-11-15 DIAGNOSIS — E785 Hyperlipidemia, unspecified: Secondary | ICD-10-CM | POA: Diagnosis not present

## 2018-11-15 DIAGNOSIS — I10 Essential (primary) hypertension: Secondary | ICD-10-CM | POA: Diagnosis not present

## 2018-11-15 DIAGNOSIS — Z471 Aftercare following joint replacement surgery: Secondary | ICD-10-CM | POA: Diagnosis not present

## 2018-11-16 ENCOUNTER — Encounter: Payer: Self-pay | Admitting: Orthopaedic Surgery

## 2018-11-16 ENCOUNTER — Ambulatory Visit (INDEPENDENT_AMBULATORY_CARE_PROVIDER_SITE_OTHER): Payer: Medicare Other | Admitting: Orthopaedic Surgery

## 2018-11-16 ENCOUNTER — Telehealth: Payer: Self-pay | Admitting: *Deleted

## 2018-11-16 DIAGNOSIS — Z96641 Presence of right artificial hip joint: Secondary | ICD-10-CM

## 2018-11-16 NOTE — Care Plan (Signed)
RNCM met with patient while in office today for her 2 week post-op appointment with Dr. Ninfa Linden. She is using a cane for ambulation and reports she is doing very well. She has completed HHPT with [facility]. She states she is not needing any pain medication. Dr. Ninfa Linden verbalized he is pleased with her progress. No further therapy needed at this time. Staples removed, steri-strips applied. May decrease Aspirin to 1 time daily. May drive when she feels she is able to push the brake fully and is not taking any narcotic pain medication. Follow up in 1 month per Dr. Ninfa Linden. Patient is aware of how to contact [facility] for any further needs.

## 2018-11-16 NOTE — Progress Notes (Signed)
The patient is 2 weeks status post a right total hip arthroplasty.  She has had her left hip replaced before.  She is done well with her home health therapy.  She is ambulating with a cane.  She feels like she is ready to drive as well.  On exam her leg lengths are equal.  The right hip incision looks good to remove the staples in place Steri-Strips.  Her leg lengths appear equal.  A long thorough discussion about think she should try to do.  She will work on a home exercise program.  We will see her back in 4 weeks to see how she is doing overall.  All question concerns were answered and addressed.  No x-rays are needed in 4 weeks.

## 2018-11-16 NOTE — Telephone Encounter (Signed)
2 week Ortho Bundle call completed. 

## 2018-11-29 ENCOUNTER — Other Ambulatory Visit: Payer: Self-pay | Admitting: Orthopedic Surgery

## 2018-11-29 MED ORDER — OXYCODONE HCL 5 MG PO TABS: 5.0000 mg | ORAL_TABLET | Freq: Four times a day (QID) | ORAL | 0 refills | Status: DC | PRN

## 2018-11-29 NOTE — Telephone Encounter (Signed)
Please advise 

## 2018-12-03 ENCOUNTER — Telehealth: Payer: Self-pay | Admitting: *Deleted

## 2018-12-03 NOTE — Telephone Encounter (Signed)
Attempted Ortho Bundle 30 day call. Left VM for patient.

## 2018-12-06 NOTE — Telephone Encounter (Signed)
RNCM called patient for her 30 day phone call and survey. Patient reports she is doing well and having no current issues. She is pleased with her progress. States she does have pain occasionally, but consistently feels she has made progress. Reviewed 30 day Patient Satisfaction survey provided by [facility]. 1. Prior to surgery I was provided sufficient education regarding my surgery and the bundle program. Patient's answer- Strongly agree 2. I was satisfied with the care I received at the facility where my surgery was performed. Patient answer- strongly agree 3. Following surgery, I received sufficient postoperative instructions. Patient answer- Strongly agree 4. I would recommend my surgeon and this bundle program to others. Patient answer- Strongly agree

## 2018-12-10 ENCOUNTER — Telehealth: Payer: Self-pay | Admitting: Orthopaedic Surgery

## 2018-12-10 MED ORDER — OXYCODONE HCL 5 MG PO TABS: 5.0000 mg | ORAL_TABLET | Freq: Four times a day (QID) | ORAL | 0 refills | Status: DC | PRN

## 2018-12-10 NOTE — Telephone Encounter (Signed)
Patient called requesting an RX refill on her Oxycodone.  Patient uses Product[facility]process development scientist in [place].  CB#[phone].  Thank you.

## 2018-12-10 NOTE — Telephone Encounter (Signed)
Please advise 

## 2018-12-14 ENCOUNTER — Encounter: Payer: Self-pay | Admitting: Orthopaedic Surgery

## 2018-12-14 ENCOUNTER — Ambulatory Visit (INDEPENDENT_AMBULATORY_CARE_PROVIDER_SITE_OTHER): Payer: Medicare Other | Admitting: Orthopaedic Surgery

## 2018-12-14 DIAGNOSIS — Z96641 Presence of right artificial hip joint: Secondary | ICD-10-CM

## 2018-12-14 NOTE — Progress Notes (Signed)
HPI: Cindy George returns today 42 days status post right total hip arthroplasty.  She is doing well she does not use any assistive device.  She states that her surgical incisions well-healed.  She is very happy with the results.  She has some stiffness whenever she first gets up to ambulate.  Physical exam: Right hip good range of motion without pain.  Right calf supple nontender.  Dorsiflexion plantar flexion ankle intact.  Impression: Status post right total hip arthroplasty  Plan: She will follow-up with [place] at 1 year postop in July 2021 we will obtain an AP pelvis and lateral view of the right hip.  Should follow-up with [place] sooner if there is any questions or concerns.

## 2018-12-15 ENCOUNTER — Telehealth: Payer: Self-pay | Admitting: *Deleted

## 2018-12-15 NOTE — Care Plan (Signed)
RNCM met with patient while in office for her 6 week post-op appointment. She reports she is doing very well and has no issues at this time. Ambulates without an assistive device and verbalized she is able to do more things around her home such as mow the grass and housework without difficulty. F/U per MD in 1 year.

## 2018-12-15 NOTE — Telephone Encounter (Signed)
RNCM saw in office during 6 week post-op.

## 2018-12-23 ENCOUNTER — Other Ambulatory Visit: Payer: Self-pay | Admitting: Orthopaedic Surgery

## 2018-12-23 ENCOUNTER — Telehealth: Payer: Self-pay | Admitting: Orthopaedic Surgery

## 2018-12-23 MED ORDER — OXYCODONE HCL 5 MG PO TABS: 5.0000 mg | ORAL_TABLET | Freq: Three times a day (TID) | ORAL | 0 refills | Status: DC | PRN

## 2018-12-23 NOTE — Telephone Encounter (Signed)
Please advise 

## 2018-12-23 NOTE — Telephone Encounter (Signed)
Patient called needing Rx refilled on her pain medicine. The number to contact patient is [phone]

## 2019-01-07 ENCOUNTER — Other Ambulatory Visit: Payer: Self-pay | Admitting: Orthopaedic Surgery

## 2019-01-07 ENCOUNTER — Telehealth: Payer: Self-pay | Admitting: Orthopaedic Surgery

## 2019-01-07 MED ORDER — OXYCODONE HCL 5 MG PO TABS: 5.0000 mg | ORAL_TABLET | Freq: Three times a day (TID) | ORAL | 0 refills | Status: DC | PRN

## 2019-01-07 NOTE — Telephone Encounter (Signed)
Please advise 

## 2019-01-07 NOTE — Telephone Encounter (Signed)
Patient called. She would like a refill on oxycodone. Her call back number is [phone]

## 2019-01-11 DIAGNOSIS — Z23 Encounter for immunization: Secondary | ICD-10-CM | POA: Diagnosis not present

## 2019-01-27 ENCOUNTER — Telehealth: Payer: Self-pay | Admitting: Physician Assistant

## 2019-01-27 ENCOUNTER — Other Ambulatory Visit: Payer: Self-pay | Admitting: Orthopaedic Surgery

## 2019-01-27 MED ORDER — HYDROCODONE-ACETAMINOPHEN 5-325 MG PO TABS: 1.0000 | ORAL_TABLET | Freq: Four times a day (QID) | ORAL | 0 refills | Status: DC | PRN

## 2019-01-27 NOTE — Progress Notes (Signed)
She is far enough out from her surgery that I need to ween her off of narcotics.  Will try Norco instead at this point.  I sent some in.

## 2019-01-27 NOTE — Telephone Encounter (Signed)
Please advise 

## 2019-01-27 NOTE — Telephone Encounter (Signed)
Patient called. She would like a refill on Oxycodone sent in.

## 2019-02-04 ENCOUNTER — Telehealth: Payer: Self-pay | Admitting: *Deleted

## 2019-02-04 NOTE — Telephone Encounter (Signed)
90 day Ortho bundle call and survey completed. 

## 2019-02-04 NOTE — Care Plan (Signed)
RNCM call to patient to check 90 day post-op status. She states she is doing well and having no real issues from her right hip. Reviewed 90 day [employer], jr. Survey.

## 2019-02-11 ENCOUNTER — Telehealth: Payer: Self-pay | Admitting: Orthopaedic Surgery

## 2019-02-11 MED ORDER — HYDROCODONE-ACETAMINOPHEN 5-325 MG PO TABS: 1.0000 | ORAL_TABLET | Freq: Four times a day (QID) | ORAL | 0 refills | Status: DC | PRN

## 2019-02-11 NOTE — Telephone Encounter (Signed)
Patient called needing Rx refilled (Hydrocodone) The number to contact patient is [phone]

## 2019-02-11 NOTE — Telephone Encounter (Signed)
LMOM of the below message  

## 2019-02-11 NOTE — Telephone Encounter (Signed)
I did send in some more.  Do let her know that she needs to start trying to back off of the narcotics because we need to stop prescribing them after this month.

## 2019-02-11 NOTE — Telephone Encounter (Signed)
Please advise 

## 2019-03-07 ENCOUNTER — Telehealth: Payer: Self-pay | Admitting: Orthopaedic Surgery

## 2019-03-07 MED ORDER — METHOCARBAMOL 500 MG PO TABS: 500.0000 mg | ORAL_TABLET | Freq: Four times a day (QID) | ORAL | 1 refills | Status: DC | PRN

## 2019-03-07 MED ORDER — HYDROCODONE-ACETAMINOPHEN 5-325 MG PO TABS: 1.0000 | ORAL_TABLET | Freq: Four times a day (QID) | ORAL | 0 refills | Status: DC | PRN

## 2019-03-07 NOTE — Telephone Encounter (Signed)
Please advise 

## 2019-03-07 NOTE — Telephone Encounter (Signed)
Do let her know that since it has been 5 months since her last surgery that she needs to wean from these medications and this is the last time I can write hydrocodone for her.

## 2019-03-07 NOTE — Telephone Encounter (Signed)
Rx refill Robaxin & Hydrocodone

## 2019-03-24 ENCOUNTER — Telehealth: Payer: Self-pay | Admitting: Physician Assistant

## 2019-03-24 MED ORDER — HYDROCODONE-ACETAMINOPHEN 5-325 MG PO TABS: 1.0000 | ORAL_TABLET | Freq: Four times a day (QID) | ORAL | 0 refills | Status: DC | PRN

## 2019-03-24 NOTE — Telephone Encounter (Signed)
Patient called and stated that she needed refill of Hydrocodone.  Please call patient to advise.  [phone]

## 2019-03-24 NOTE — Telephone Encounter (Signed)
I will send in 1 more hydrocodone for her.  It has now been 5 months since her last surgery.  She needs to wean herself from that medication because we would be unable to provide it again after this 1.

## 2019-03-24 NOTE — Telephone Encounter (Signed)
Please advise 

## 2019-04-14 ENCOUNTER — Telehealth: Payer: Self-pay | Admitting: Orthopaedic Surgery

## 2019-04-14 NOTE — Telephone Encounter (Signed)
LMOM for patient of the below message  

## 2019-04-14 NOTE — Telephone Encounter (Signed)
Please advise 

## 2019-04-14 NOTE — Telephone Encounter (Signed)
Patient called to request a refill of Hydrocodone.  Please call patient to advise.  [phone]

## 2019-04-14 NOTE — Telephone Encounter (Signed)
It has now been almost 6 months since her surgery.  I can no refill this medication.

## 2019-10-10 ENCOUNTER — Ambulatory Visit: Payer: Medicare Other | Admitting: Physician Assistant

## 2019-10-11 ENCOUNTER — Encounter: Payer: Self-pay | Admitting: Orthopaedic Surgery

## 2019-10-11 ENCOUNTER — Ambulatory Visit (INDEPENDENT_AMBULATORY_CARE_PROVIDER_SITE_OTHER): Payer: Medicare Other

## 2019-10-11 ENCOUNTER — Other Ambulatory Visit: Payer: Self-pay

## 2019-10-11 ENCOUNTER — Ambulatory Visit (INDEPENDENT_AMBULATORY_CARE_PROVIDER_SITE_OTHER): Payer: Medicare Other | Admitting: Physician Assistant

## 2019-10-11 DIAGNOSIS — Z96641 Presence of right artificial hip joint: Secondary | ICD-10-CM | POA: Diagnosis not present

## 2019-10-11 NOTE — Progress Notes (Signed)
HPI: Ms. Anfinson returns 10 months status post right total hip arthroplasty overall doing well.  She has good range of motion and strength.  She has no complaints.  Review of systems: See HPI otherwise negative or noncontributory.  Physical exam: General well-developed well-nourished female in no acute distress.  Ambulates without any assistive device.  Right lower extremity good range of motion of the right hip without pain.  Right calf supple nontender.  Right ankle dorsiflexion plantarflexion intact.  Radiographs: AP pelvis lateral view right hip: No acute fractures.  Bilateral hips well located.  Right total hip arthroplasty components well-seated.  No evidence of hardware failure.  Impression: Status post right total hip arthroplasty 10 months postop  Plan: She will follow-up with [place] on as-needed basis.  Questions encouraged and answered at length.

## 2019-10-20 ENCOUNTER — Other Ambulatory Visit: Payer: Self-pay | Admitting: Orthopaedic Surgery

## 2019-11-10 ENCOUNTER — Telehealth: Payer: Self-pay | Admitting: *Deleted

## 2019-11-10 NOTE — Telephone Encounter (Signed)
1 year Ortho bundle call completed. ?

## 2019-12-13 DIAGNOSIS — H524 Presbyopia: Secondary | ICD-10-CM | POA: Diagnosis not present

## 2019-12-13 DIAGNOSIS — H2513 Age-related nuclear cataract, bilateral: Secondary | ICD-10-CM | POA: Diagnosis not present

## 2019-12-13 DIAGNOSIS — H25013 Cortical age-related cataract, bilateral: Secondary | ICD-10-CM | POA: Diagnosis not present

## 2019-12-13 DIAGNOSIS — H04123 Dry eye syndrome of bilateral lacrimal glands: Secondary | ICD-10-CM | POA: Diagnosis not present

## 2019-12-30 DIAGNOSIS — Z23 Encounter for immunization: Secondary | ICD-10-CM | POA: Diagnosis not present

## 2020-01-19 DIAGNOSIS — E7849 Other hyperlipidemia: Secondary | ICD-10-CM | POA: Diagnosis not present

## 2020-01-19 DIAGNOSIS — Z23 Encounter for immunization: Secondary | ICD-10-CM | POA: Diagnosis not present

## 2020-01-19 DIAGNOSIS — I10 Essential (primary) hypertension: Secondary | ICD-10-CM | POA: Diagnosis not present

## 2020-01-19 DIAGNOSIS — Z6834 Body mass index (BMI) 34.0-34.9, adult: Secondary | ICD-10-CM | POA: Diagnosis not present

## 2020-01-19 DIAGNOSIS — Z1331 Encounter for screening for depression: Secondary | ICD-10-CM | POA: Diagnosis not present

## 2020-01-19 DIAGNOSIS — E063 Autoimmune thyroiditis: Secondary | ICD-10-CM | POA: Diagnosis not present

## 2020-01-19 DIAGNOSIS — Z0000 Encounter for general adult medical examination without abnormal findings: Secondary | ICD-10-CM | POA: Diagnosis not present

## 2020-01-19 DIAGNOSIS — Z1389 Encounter for screening for other disorder: Secondary | ICD-10-CM | POA: Diagnosis not present

## 2020-02-20 DIAGNOSIS — Z1212 Encounter for screening for malignant neoplasm of rectum: Secondary | ICD-10-CM | POA: Diagnosis not present

## 2020-02-20 DIAGNOSIS — Z1211 Encounter for screening for malignant neoplasm of colon: Secondary | ICD-10-CM | POA: Diagnosis not present

## 2020-03-08 ENCOUNTER — Encounter: Payer: Self-pay | Admitting: Internal Medicine

## 2020-03-15 DIAGNOSIS — H11153 Pinguecula, bilateral: Secondary | ICD-10-CM | POA: Diagnosis not present

## 2020-03-15 DIAGNOSIS — H04123 Dry eye syndrome of bilateral lacrimal glands: Secondary | ICD-10-CM | POA: Diagnosis not present

## 2020-03-15 DIAGNOSIS — H1011 Acute atopic conjunctivitis, right eye: Secondary | ICD-10-CM | POA: Diagnosis not present

## 2020-04-23 ENCOUNTER — Ambulatory Visit: Payer: Medicare Other | Admitting: Gastroenterology

## 2020-05-31 NOTE — H&P (View-Only) (Signed)
Referring Provider: Sharilyn Sites, MD Primary Care Physician:  Sharilyn Sites, MD Primary Gastroenterologist:  Dr. Gala Romney   Chief Complaint  Patient presents with  . +cologuard    Last tcs 11 yrs ago at [facility]. No problems    HPI:   Cindy George is a 70 y.o. female presenting today at the request of Sharilyn Sites, MD for positive Cologuard.  Had cologard in November that was positive. Had a colonoscopy in 2011 that was ok at [facility]. Doesn't think she had polyps. Told to come back in 10 years. No GI problems. Occasional constipation or loose stools. BMs every 2-3 days. Stools are soft and formed. Feels stools are complete. This is her baseline. No blood in the stool or black stools unless she takes an iron pill. Takes this 4-5 times a week. Thought she may need iron, but she has not been advised a provider.  Takes 81 mg or 325 mg because she thought maybe she should.  This was also not recommended by her provider.  Rare use of ibuprofen for headache. No abdominal pain. No GERD, nausea, vomiting, or dysphagia. No unintentional weight loss.   Since finding out about her positive Cologuard, she has been extremely anxious.  Because of this, she was started on BuSpar.  Anemia noted with hemoglobin in the upper 9/10 range in 2018 and again in 2020.  This was at the time of left total hip and right total hip arthroplasty.  Recent labs provided with referral today completed 01/20/2020 with hemoglobin 14.0.  Past Medical History:  Diagnosis Date  . Anemia    takes Ferrous Sulfate daily  . Arthritis   . Complication of anesthesia   . Heart murmur    as a child. No doctors have mentioned it to her.  . High blood pressure    takes Avalide daily  . High cholesterol    takes Atorvastatin daily  . History of bronchitis 2016  . History of kidney stones   . Hypothyroidism    takes Synthroid daily  . Joint pain   . Pneumonia    at age 80  . PONV (postoperative nausea and vomiting)   . Urinary  urgency     Past Surgical History:  Procedure Laterality Date  . colonscopy  2011  . KIDNEY STONE SURGERY  2011  . THYROID SURGERY    . TOTAL ABDOMINAL HYSTERECTOMY    . TOTAL HIP ARTHROPLASTY Left 04/08/2016   Procedure: LEFT TOTAL HIP ARTHROPLASTY ANTERIOR APPROACH;  Surgeon: Mcarthur Rossetti, MD;  Location: [place];  Service: Orthopedics;  Laterality: Left;  . TOTAL HIP ARTHROPLASTY Right 11/02/2018   Procedure: RIGHT TOTAL HIP ARTHROPLASTY ANTERIOR APPROACH;  Surgeon: Mcarthur Rossetti, MD;  Location: [place];  Service: Orthopedics;  Laterality: Right;  . TOTAL KNEE ARTHROPLASTY  05/19/2011   Procedure: TOTAL KNEE ARTHROPLASTY;  Surgeon: Arther Abbott, MD;  Location: [facility];  Service: Orthopedics;  Laterality: Right;  computer assisted depuy  . TUBAL LIGATION     27 yrs ago    Current Outpatient Medications  Medication Sig Dispense Refill  . acetaminophen (TYLENOL) 500 MG tablet Take 1,000 mg by mouth every 6 (six) hours as needed.    Marland Kitchen aspirin 325 MG tablet Take 325 mg by mouth daily.    Marland Kitchen aspirin 81 MG chewable tablet Chew 1 tablet (81 mg total) by mouth 2 (two) times daily. 30 tablet 0  . atorvastatin (LIPITOR) 10 MG tablet Take 10 mg by mouth daily.    Marland Kitchen  busPIRone (BUSPAR) 5 MG tablet Take 5 mg by mouth as needed.    . calcium carbonate (OSCAL) 1500 (600 Ca) MG TABS tablet Take 600 mg of elemental calcium by mouth once a week.     . diphenhydrAMINE (BENADRYL) 25 MG tablet Take 25 mg by mouth daily as needed for allergies.    . Ferrous Sulfate (IRON) 325 (65 Fe) MG TABS Take 1 tablet by mouth. Takes 4 days/week    . ibuprofen (ADVIL) 200 MG tablet Take 600 mg by mouth daily as needed for moderate pain. rare    . irbesartan-hydrochlorothiazide (AVALIDE) 300-12.5 MG tablet Take 1 tablet by mouth daily.    Marland Kitchen levothyroxine (SYNTHROID, LEVOTHROID) 175 MCG tablet Take 175 mcg by mouth daily.    . melatonin 5 MG TABS Take 5 mg by mouth as needed.    . Multiple Vitamin  (MULITIVITAMIN WITH MINERALS) TABS Take 1 tablet by mouth daily.     . Polyvinyl Alcohol-Povidone (REFRESH OP) Place 1 drop into both eyes daily as needed (dry eyes).    . TURMERIC PO Take by mouth. Takes sometimes    . Zinc 30 MG TABS Take by mouth. Takes sometimes    . polyethylene glycol-electrolytes (NULYTELY) 420 g solution As directed 4000 mL 0   No current facility-administered medications for this visit.    Allergies as of 06/01/2020  . (No Known Allergies)    Family History  Problem Relation Age of Onset  . Colon cancer Neg Hx     Social History   Socioeconomic History  . Marital status: Divorced    Spouse name: Not on file  . Number of children: Not on file  . Years of education: Not on file  . Highest education level: Not on file  Occupational History  . Not on file  Tobacco Use  . Smoking status: Never Smoker  . Smokeless tobacco: Never Used  Substance and Sexual Activity  . Alcohol use: No  . Drug use: No  . Sexual activity: Never  Other Topics Concern  . Not on file  Social History Narrative  . Not on file   Social Determinants of Health   Financial Resource Strain: Not on file  Food Insecurity: Not on file  Transportation Needs: Not on file  Physical Activity: Not on file  Stress: Not on file  Social Connections: Not on file  Intimate Partner Violence: Not on file    Review of Systems: Gen: Denies any fever, chills, cold or flulike symptoms, lightheadedness, dizziness, presyncope, syncope. CV: Denies chest pain or palpitations. Resp: Occasional SOB with exertion. Denies cough.  GI: See HPI GU : Denies urinary burning, urinary frequency, urinary hesitancy MS: Denies joint pain Derm: Denies rash Psych: Significant anxiety related to positive Cologuard. Heme: See HPI  Physical Exam: BP (!) 140/100 (BP Location: Right Arm)   Pulse (!) 116   Temp (!) 97.1 F (36.2 C) (Temporal)   Ht 4' 10"  (1.473 m)   Wt 163 lb 9.6 oz (74.2 kg)   BMI  34.19 kg/m  General:   Alert and oriented. Pleasant and cooperative. Well-nourished and well-developed.  Head:  Normocephalic and atraumatic. Eyes:  Without icterus, sclera clear and conjunctiva pink.  Ears:  Normal auditory acuity. Lungs:  Clear to auscultation bilaterally. No wheezes, rales, or rhonchi. No distress.  Heart:  S1, S2 present without murmurs appreciated.  Abdomen:  +BS, soft, non-tender and non-distended. No HSM noted. No guarding or rebound. No masses appreciated.  Rectal:  Deferred  Msk:  Symmetrical without gross deformities. Normal posture. Extremities:  Without edema. Neurologic:  Alert and  oriented x4;  grossly normal neurologically. Skin:  Intact without significant lesions or rashes. Psych: Anxious mood.  Labs:  01/20/2020 CBC: WBC 4.8, hemoglobin 14.0, hematocrit 41.3, MCV 92, MCH 31.2, MCH 33.9, platelets 205 CMP: Glucose 99, BUN 13, creatinine 0.77, sodium 141, potassium 3.9, calcium 9.8, albumin 4.7, total bilirubin 0.8, alk phos 111, AST 25, ALT 25 Lipid panel: Total cholesterol 173, triglycerides 124, HDL 46, LDL 105 (H) TSH: 3.380  Assessment: 70 year old female with history of HTN, HLD, hypothyroidism who recently had positive Cologuard in November 2021 presenting today to discuss scheduling colonoscopy.  Reports prior colonoscopy in 2011 without polyps. She has no significant upper or lower GI symptoms.  She does report occasional constipation, but baseline with BMs every 2-3 days that are soft and formed.  Denies BRBPR, melena, or unintentional weight loss.  Stools are dark at times if taking oral iron which she takes on her own.  This was not recommended by a provider.  Also takes 325 mg aspirin or 81 mg aspirin on her own, not recommended by provider.  No history of heart disease or stroke.  Labs in October 2021 with CBC and CMP unremarkable.  Notably, patient's blood pressure is elevated today.  I suspect this is secondary to significant anxiety regarding  this visit.  Plan: 1.  Proceed with colonoscopy with propofol with Dr. Gala Romney in the near future. The risks, benefits, and alternatives have been discussed with the patient in detail. The patient states understanding and desires to proceed.  ASA II Propofol due to significant anxiety. Hold iron for 7 days prior to colonoscopy. 2.  Recommended Benefiber 2 teaspoons daily x1 week, then increase up to 3 times daily as tolerated for occasional constipation. 3.  Stop 325 mg aspirin and discussed with PCP whether she should continue 81 mg aspirin. 4.  Discussed with PCP whether she needs iron supplementation.  I do not see any need for this at this time. 5.  Follow-up as needed.

## 2020-05-31 NOTE — Progress Notes (Signed)
Referring Provider: Sharilyn Sites, MD Primary Care Physician:  Sharilyn Sites, MD Primary Gastroenterologist:  Dr. Gala Romney   Chief Complaint  Patient presents with  . +cologuard    Last tcs 11 yrs ago at [facility]. No problems    HPI:   Cindy George is a 70 y.o. female presenting today at the request of Sharilyn Sites, MD for positive Cologuard.  Had cologard in November that was positive. Had a colonoscopy in 2011 that was ok at [facility]. Doesn't think she had polyps. Told to come back in 10 years. No GI problems. Occasional constipation or loose stools. BMs every 2-3 days. Stools are soft and formed. Feels stools are complete. This is her baseline. No blood in the stool or black stools unless she takes an iron pill. Takes this 4-5 times a week. Thought she may need iron, but she has not been advised a provider.  Takes 81 mg or 325 mg because she thought maybe she should.  This was also not recommended by her provider.  Rare use of ibuprofen for headache. No abdominal pain. No GERD, nausea, vomiting, or dysphagia. No unintentional weight loss.   Since finding out about her positive Cologuard, she has been extremely anxious.  Because of this, she was started on BuSpar.  Anemia noted with hemoglobin in the upper 9/10 range in 2018 and again in 2020.  This was at the time of left total hip and right total hip arthroplasty.  Recent labs provided with referral today completed 01/20/2020 with hemoglobin 14.0.  Past Medical History:  Diagnosis Date  . Anemia    takes Ferrous Sulfate daily  . Arthritis   . Complication of anesthesia   . Heart murmur    as a child. No doctors have mentioned it to her.  . High blood pressure    takes Avalide daily  . High cholesterol    takes Atorvastatin daily  . History of bronchitis 2016  . History of kidney stones   . Hypothyroidism    takes Synthroid daily  . Joint pain   . Pneumonia    at age 29  . PONV (postoperative nausea and vomiting)   . Urinary  urgency     Past Surgical History:  Procedure Laterality Date  . colonscopy  2011  . KIDNEY STONE SURGERY  2011  . THYROID SURGERY    . TOTAL ABDOMINAL HYSTERECTOMY    . TOTAL HIP ARTHROPLASTY Left 04/08/2016   Procedure: LEFT TOTAL HIP ARTHROPLASTY ANTERIOR APPROACH;  Surgeon: Mcarthur Rossetti, MD;  Location: [place];  Service: Orthopedics;  Laterality: Left;  . TOTAL HIP ARTHROPLASTY Right 11/02/2018   Procedure: RIGHT TOTAL HIP ARTHROPLASTY ANTERIOR APPROACH;  Surgeon: Mcarthur Rossetti, MD;  Location: [place];  Service: Orthopedics;  Laterality: Right;  . TOTAL KNEE ARTHROPLASTY  05/19/2011   Procedure: TOTAL KNEE ARTHROPLASTY;  Surgeon: Arther Abbott, MD;  Location: [facility];  Service: Orthopedics;  Laterality: Right;  computer assisted depuy  . TUBAL LIGATION     27 yrs ago    Current Outpatient Medications  Medication Sig Dispense Refill  . acetaminophen (TYLENOL) 500 MG tablet Take 1,000 mg by mouth every 6 (six) hours as needed.    Marland Kitchen aspirin 325 MG tablet Take 325 mg by mouth daily.    Marland Kitchen aspirin 81 MG chewable tablet Chew 1 tablet (81 mg total) by mouth 2 (two) times daily. 30 tablet 0  . atorvastatin (LIPITOR) 10 MG tablet Take 10 mg by mouth daily.    Marland Kitchen  busPIRone (BUSPAR) 5 MG tablet Take 5 mg by mouth as needed.    . calcium carbonate (OSCAL) 1500 (600 Ca) MG TABS tablet Take 600 mg of elemental calcium by mouth once a week.     . diphenhydrAMINE (BENADRYL) 25 MG tablet Take 25 mg by mouth daily as needed for allergies.    . Ferrous Sulfate (IRON) 325 (65 Fe) MG TABS Take 1 tablet by mouth. Takes 4 days/week    . ibuprofen (ADVIL) 200 MG tablet Take 600 mg by mouth daily as needed for moderate pain. rare    . irbesartan-hydrochlorothiazide (AVALIDE) 300-12.5 MG tablet Take 1 tablet by mouth daily.    Marland Kitchen levothyroxine (SYNTHROID, LEVOTHROID) 175 MCG tablet Take 175 mcg by mouth daily.    . melatonin 5 MG TABS Take 5 mg by mouth as needed.    . Multiple Vitamin  (MULITIVITAMIN WITH MINERALS) TABS Take 1 tablet by mouth daily.     . Polyvinyl Alcohol-Povidone (REFRESH OP) Place 1 drop into both eyes daily as needed (dry eyes).    . TURMERIC PO Take by mouth. Takes sometimes    . Zinc 30 MG TABS Take by mouth. Takes sometimes    . polyethylene glycol-electrolytes (NULYTELY) 420 g solution As directed 4000 mL 0   No current facility-administered medications for this visit.    Allergies as of 06/01/2020  . (No Known Allergies)    Family History  Problem Relation Age of Onset  . Colon cancer Neg Hx     Social History   Socioeconomic History  . Marital status: Divorced    Spouse name: Not on file  . Number of children: Not on file  . Years of education: Not on file  . Highest education level: Not on file  Occupational History  . Not on file  Tobacco Use  . Smoking status: Never Smoker  . Smokeless tobacco: Never Used  Substance and Sexual Activity  . Alcohol use: No  . Drug use: No  . Sexual activity: Never  Other Topics Concern  . Not on file  Social History Narrative  . Not on file   Social Determinants of Health   Financial Resource Strain: Not on file  Food Insecurity: Not on file  Transportation Needs: Not on file  Physical Activity: Not on file  Stress: Not on file  Social Connections: Not on file  Intimate Partner Violence: Not on file    Review of Systems: Gen: Denies any fever, chills, cold or flulike symptoms, lightheadedness, dizziness, presyncope, syncope. CV: Denies chest pain or palpitations. Resp: Occasional SOB with exertion. Denies cough.  GI: See HPI GU : Denies urinary burning, urinary frequency, urinary hesitancy MS: Denies joint pain Derm: Denies rash Psych: Significant anxiety related to positive Cologuard. Heme: See HPI  Physical Exam: BP (!) 140/100 (BP Location: Right Arm)   Pulse (!) 116   Temp (!) 97.1 F (36.2 C) (Temporal)   Ht 4' 10"  (1.473 m)   Wt 163 lb 9.6 oz (74.2 kg)   BMI  34.19 kg/m  General:   Alert and oriented. Pleasant and cooperative. Well-nourished and well-developed.  Head:  Normocephalic and atraumatic. Eyes:  Without icterus, sclera clear and conjunctiva pink.  Ears:  Normal auditory acuity. Lungs:  Clear to auscultation bilaterally. No wheezes, rales, or rhonchi. No distress.  Heart:  S1, S2 present without murmurs appreciated.  Abdomen:  +BS, soft, non-tender and non-distended. No HSM noted. No guarding or rebound. No masses appreciated.  Rectal:  Deferred  Msk:  Symmetrical without gross deformities. Normal posture. Extremities:  Without edema. Neurologic:  Alert and  oriented x4;  grossly normal neurologically. Skin:  Intact without significant lesions or rashes. Psych: Anxious mood.  Labs:  01/20/2020 CBC: WBC 4.8, hemoglobin 14.0, hematocrit 41.3, MCV 92, MCH 31.2, MCH 33.9, platelets 205 CMP: Glucose 99, BUN 13, creatinine 0.77, sodium 141, potassium 3.9, calcium 9.8, albumin 4.7, total bilirubin 0.8, alk phos 111, AST 25, ALT 25 Lipid panel: Total cholesterol 173, triglycerides 124, HDL 46, LDL 105 (H) TSH: 3.380  Assessment: 70 year old female with history of HTN, HLD, hypothyroidism who recently had positive Cologuard in November 2021 presenting today to discuss scheduling colonoscopy.  Reports prior colonoscopy in 2011 without polyps. She has no significant upper or lower GI symptoms.  She does report occasional constipation, but baseline with BMs every 2-3 days that are soft and formed.  Denies BRBPR, melena, or unintentional weight loss.  Stools are dark at times if taking oral iron which she takes on her own.  This was not recommended by a provider.  Also takes 325 mg aspirin or 81 mg aspirin on her own, not recommended by provider.  No history of heart disease or stroke.  Labs in October 2021 with CBC and CMP unremarkable.  Notably, patient's blood pressure is elevated today.  I suspect this is secondary to significant anxiety regarding  this visit.  Plan: 1.  Proceed with colonoscopy with propofol with Dr. Gala Romney in the near future. The risks, benefits, and alternatives have been discussed with the patient in detail. The patient states understanding and desires to proceed.  ASA II Propofol due to significant anxiety. Hold iron for 7 days prior to colonoscopy. 2.  Recommended Benefiber 2 teaspoons daily x1 week, then increase up to 3 times daily as tolerated for occasional constipation. 3.  Stop 325 mg aspirin and discussed with PCP whether she should continue 81 mg aspirin. 4.  Discussed with PCP whether she needs iron supplementation.  I do not see any need for this at this time. 5.  Follow-up as needed.

## 2020-06-01 ENCOUNTER — Encounter: Payer: Self-pay | Admitting: *Deleted

## 2020-06-01 ENCOUNTER — Ambulatory Visit (INDEPENDENT_AMBULATORY_CARE_PROVIDER_SITE_OTHER): Payer: Medicare Other | Admitting: Gastroenterology

## 2020-06-01 ENCOUNTER — Other Ambulatory Visit: Payer: Self-pay | Admitting: *Deleted

## 2020-06-01 ENCOUNTER — Encounter: Payer: Self-pay | Admitting: Gastroenterology

## 2020-06-01 ENCOUNTER — Other Ambulatory Visit: Payer: Self-pay

## 2020-06-01 DIAGNOSIS — R195 Other fecal abnormalities: Secondary | ICD-10-CM | POA: Insufficient documentation

## 2020-06-01 MED ORDER — PEG 3350-KCL-NA BICARB-NACL 420 G PO SOLR: ORAL | 0 refills | Status: DC

## 2020-06-01 NOTE — Patient Instructions (Signed)
We will arrange for you to have a colonoscopy in the near future with Dr. Jena Gauss. Do not take iron for 7 days prior to your procedure.  I recommend you start Benefiber 2 teaspoons daily x1 week.  You may increase to 3 times daily as tolerated.  This is to help with mild intermittent constipation.  No need for you to take 325 mg aspirin.  I recommend you discuss with your primary care provider if he recommends 81 mg aspirin daily.  I also do not see a reason that you need to be on iron at this time.  Recommend you discuss with primary care to see if this is needed.  We will plan to see back as needed.  Do not hesitate to call if you have any new GI concerns.  It was nice meeting you today!  Ermalinda Memos, PA-C [facility]

## 2020-06-25 ENCOUNTER — Encounter (HOSPITAL_COMMUNITY): Payer: Self-pay | Admitting: Internal Medicine

## 2020-06-26 ENCOUNTER — Other Ambulatory Visit (HOSPITAL_COMMUNITY): Payer: Medicare Other

## 2020-06-26 ENCOUNTER — Other Ambulatory Visit: Payer: Self-pay

## 2020-06-26 ENCOUNTER — Other Ambulatory Visit (HOSPITAL_COMMUNITY)
Admission: RE | Admit: 2020-06-26 | Discharge: 2020-06-26 | Disposition: A | Discharge status: Home / Self Care | Payer: Medicare Other | Source: Ambulatory Visit | Attending: Internal Medicine | Admitting: Internal Medicine

## 2020-06-26 DIAGNOSIS — Z01812 Encounter for preprocedural laboratory examination: Secondary | ICD-10-CM | POA: Insufficient documentation

## 2020-06-26 DIAGNOSIS — Z20822 Contact with and (suspected) exposure to covid-19: Secondary | ICD-10-CM | POA: Diagnosis not present

## 2020-06-26 LAB — SARS CORONAVIRUS 2 (TAT 6-24 HRS): SARS Coronavirus 2: NEGATIVE

## 2020-06-28 ENCOUNTER — Ambulatory Visit (HOSPITAL_COMMUNITY)
Admission: RE | Admit: 2020-06-28 | Discharge: 2020-06-28 | Disposition: A | Discharge status: Home / Self Care | Payer: Medicare Other | Attending: Internal Medicine | Admitting: Internal Medicine

## 2020-06-28 ENCOUNTER — Ambulatory Visit (HOSPITAL_COMMUNITY): Payer: Medicare Other | Admitting: Anesthesiology

## 2020-06-28 ENCOUNTER — Other Ambulatory Visit: Payer: Self-pay

## 2020-06-28 ENCOUNTER — Encounter (HOSPITAL_COMMUNITY)
Admission: RE | Disposition: A | Discharge status: Home / Self Care | Payer: Self-pay | Source: Home / Self Care | Attending: Internal Medicine

## 2020-06-28 ENCOUNTER — Encounter (HOSPITAL_COMMUNITY): Payer: Self-pay | Admitting: Internal Medicine

## 2020-06-28 DIAGNOSIS — Z79899 Other long term (current) drug therapy: Secondary | ICD-10-CM | POA: Diagnosis not present

## 2020-06-28 DIAGNOSIS — R195 Other fecal abnormalities: Secondary | ICD-10-CM | POA: Diagnosis not present

## 2020-06-28 DIAGNOSIS — E890 Postprocedural hypothyroidism: Secondary | ICD-10-CM | POA: Diagnosis not present

## 2020-06-28 DIAGNOSIS — K5730 Diverticulosis of large intestine without perforation or abscess without bleeding: Secondary | ICD-10-CM | POA: Diagnosis not present

## 2020-06-28 HISTORY — PX: COLONOSCOPY WITH PROPOFOL: SHX5780

## 2020-06-28 SURGERY — COLONOSCOPY WITH PROPOFOL
Anesthesia: General

## 2020-06-28 MED ORDER — PROPOFOL 10 MG/ML IV BOLUS
INTRAVENOUS | Status: DC | PRN
  Administered 2020-06-28: 50 mg via INTRAVENOUS

## 2020-06-28 MED ORDER — PROPOFOL 10 MG/ML IV BOLUS
INTRAVENOUS | Status: AC
  Filled 2020-06-28: qty 60

## 2020-06-28 MED ORDER — PROPOFOL 10 MG/ML IV BOLUS
INTRAVENOUS | Status: AC
  Filled 2020-06-28: qty 20

## 2020-06-28 MED ORDER — PROPOFOL 500 MG/50ML IV EMUL
INTRAVENOUS | Status: DC | PRN
  Administered 2020-06-28: 150 ug/kg/min via INTRAVENOUS

## 2020-06-28 MED ORDER — LACTATED RINGERS IV SOLN: INTRAVENOUS | Status: DC

## 2020-06-28 NOTE — Anesthesia Postprocedure Evaluation (Signed)
Anesthesia Post Note  Patient: Cindy George  Procedure(s) Performed: COLONOSCOPY WITH PROPOFOL (N/A )  Patient location during evaluation: Phase II Anesthesia Type: General Level of consciousness: awake, oriented, awake and alert and patient cooperative Pain management: satisfactory to patient Vital Signs Assessment: post-procedure vital signs reviewed and stable Respiratory status: nonlabored ventilation, spontaneous breathing and respiratory function stable Cardiovascular status: stable Postop Assessment: no apparent nausea or vomiting Anesthetic complications: no   No complications documented.   Last Vitals:  Vitals:   06/28/20 0754  BP: 127/71  Temp: 36.7 C  SpO2: 97%    Last Pain:  Vitals:   06/28/20 0939  TempSrc:   PainSc: 0-No pain                 Amai Cappiello

## 2020-06-28 NOTE — Interval H&P Note (Signed)
History and Physical Interval Note:  06/28/2020 9:23 AM  Cindy George  has presented today for surgery, with the diagnosis of positive cologuard.  The various methods of treatment have been discussed with the patient and family. After consideration of risks, benefits and other options for treatment, the patient has consented to  Procedure(s) with comments: COLONOSCOPY WITH PROPOFOL (N/A) - am, requests as early as possible as a surgical intervention.  The patient's history has been reviewed, patient examined, no change in status, stable for surgery.  I have reviewed the patient's chart and labs.  Questions were answered to the patient's satisfaction.     Shaneisha Burkel  No change . TCS today.The risks, benefits, limitations, alternatives and imponderables have been reviewed with the patient. Questions have been answered. All parties are agreeable.

## 2020-06-28 NOTE — Transfer of Care (Signed)
Immediate Anesthesia Transfer of Care Note  Patient: Cindy George  Procedure(s) Performed: COLONOSCOPY WITH PROPOFOL (N/A )  Patient Location: PACU  Anesthesia Type:General  Level of Consciousness: awake, alert , oriented and patient cooperative  Airway & Oxygen Therapy: Patient Spontanous Breathing  Post-op Assessment: Report given to RN, Post -op Vital signs reviewed and stable and Patient moving all extremities X 4  Post vital signs: Reviewed and stable  Last Vitals:  Vitals Value Taken Time  BP    Temp    Pulse    Resp    SpO2      Last Pain:  Vitals:   06/28/20 0939  TempSrc:   PainSc: 0-No pain      Patients Stated Pain Goal: 5 (06/28/20 0754)  Complications: No complications documented.

## 2020-06-28 NOTE — Op Note (Signed)
[facility] Patient Name: Cindy George Procedure Date: 06/28/2020 9:14 AM MRN: [id number] Date of Birth: [birth year] Attending MD: Gennette Pac , MD CSN: [id number] Age: 70 Admit Type: Outpatient Procedure:                Colonoscopy Indications:              Positive Cologuard test Providers:                Gennette Pac, MD, Brain Hilts, RN, Dyann Ruddle Referring MD:              Medicines:                Propofol per Anesthesia Complications:            No immediate complications. Estimated Blood Loss:     Estimated blood loss: none. Procedure:                Pre-Anesthesia Assessment:                           - Prior to the procedure, a History and Physical                            was performed, and patient medications and                            allergies were reviewed. The patient's tolerance of                            previous anesthesia was also reviewed. The risks                            and benefits of the procedure and the sedation                            options and risks were discussed with the patient.                            All questions were answered, and informed consent                            was obtained. ASA Grade Assessment: II - A patient                            with mild systemic disease. After reviewing the                            risks and benefits, the patient was deemed in                            satisfactory condition to undergo the procedure.  After obtaining informed consent, the colonoscope                            was passed under direct vision. Throughout the                            procedure, the patient's blood pressure, pulse, and                            oxygen saturations were monitored continuously. The                            CF-HQ190L ([id number]) scope was introduced through                            the anus and advanced to  the the cecum, identified                            by appendiceal orifice and ileocecal valve. The                            colonoscopy was performed without difficulty. The                            patient tolerated the procedure well. The quality                            of the bowel preparation was adequate. Scope In: 9:41:46 AM Scope Out: 9:59:24 AM Scope Withdrawal Time: 0 hours 10 minutes 33 seconds  Total Procedure Duration: 0 hours 17 minutes 38 seconds  Findings:      The perianal and digital rectal examinations were normal.      Multiple small and large-mouthed diverticula were found in the entire       colon.      The exam was otherwise without abnormality on direct and retroflexion       views. Impression:               - Diverticulosis in the entire examined colon.                           - The examination was otherwise normal on direct                            and retroflexion views.                           - No specimens collected. Colonic mucosa looked                            good today aside from diverticula. Cologuard                            testing is associated with a 12% false positive rate Moderate Sedation:      Moderate (conscious) sedation was personally administered by an  anesthesia professional. The following parameters were monitored: oxygen       saturation, heart rate, blood pressure, and response to care. Recommendation:           - Patient has a contact number available for                            emergencies. The signs and symptoms of potential                            delayed complications were discussed with the                            patient. Return to normal activities tomorrow.                            Written discharge instructions were provided to the                            patient.                           - Advance diet as tolerated.                           - Continue present medications.                            - No repeat colonoscopy due to age.                           - Return to GI clinic after studies are complete. Procedure Code(s):        --- Professional ---                           [phone], Colonoscopy, flexible; diagnostic, including                            collection of specimen(s) by brushing or washing,                            when performed (separate procedure) Diagnosis Code(s):        --- Professional ---                           R19.5, Other fecal abnormalities                           K57.30, Diverticulosis of large intestine without                            perforation or abscess without bleeding CPT copyright 2019 American Medical Association. All rights reserved. The codes documented in this report are preliminary and upon coder review may  be revised to meet current compliance requirements. Gerrit Friends. Aamari West, MD Gennette Pac, MD 06/28/2020 3:00:51 PM This report has been signed electronically. Number of Addenda: 0

## 2020-06-28 NOTE — Discharge Instructions (Signed)
Colonoscopy Discharge Instructions  Read the instructions outlined below and refer to this sheet in the next few weeks. These discharge instructions provide you with general information on caring for yourself after you leave the hospital. Your doctor may also give you specific instructions. While your treatment has been planned according to the most current medical practices available, unavoidable complications occasionally occur. If you have any problems or questions after discharge, call Dr. Jena Gauss at [phone]. ACTIVITY  You may resume your regular activity, but move at a slower pace for the next 24 hours.   Take frequent rest periods for the next 24 hours.   Walking will help get rid of the air and reduce the bloated feeling in your belly (abdomen).   No driving for 24 hours (because of the medicine (anesthesia) used during the test).    Do not sign any important legal documents or operate any machinery for 24 hours (because of the anesthesia used during the test).  NUTRITION  Drink plenty of fluids.   You may resume your normal diet as instructed by your doctor.   Begin with a light meal and progress to your normal diet. Heavy or fried foods are harder to digest and may make you feel sick to your stomach (nauseated).   Avoid alcoholic beverages for 24 hours or as instructed.  MEDICATIONS  You may resume your normal medications unless your doctor tells you otherwise.  WHAT YOU CAN EXPECT TODAY  Some feelings of bloating in the abdomen.   Passage of more gas than usual.   Spotting of blood in your stool or on the toilet paper.  IF YOU HAD POLYPS REMOVED DURING THE COLONOSCOPY:  No aspirin products for 7 days or as instructed.   No alcohol for 7 days or as instructed.   Eat a soft diet for the next 24 hours.  FINDING OUT THE RESULTS OF YOUR TEST Not all test results are available during your visit. If your test results are not back during the visit, make an appointment  with your caregiver to find out the results. Do not assume everything is normal if you have not heard from your caregiver or the medical facility. It is important for you to follow up on all of your test results.  SEEK IMMEDIATE MEDICAL ATTENTION IF:  You have more than a spotting of blood in your stool.   Your belly is swollen (abdominal distention).   You are nauseated or vomiting.   You have a temperature over 101.   You have abdominal pain or discomfort that is severe or gets worse throughout the day.   No polyps found today  Diverticulosis information provided  A future colonoscopy is not recommended unless new symptoms develop   Diverticulosis  Diverticulosis is a condition that develops when small pouches (diverticula) form in the wall of the large intestine (colon). The colon is where water is absorbed and stool (feces) is formed. The pouches form when the inside layer of the colon pushes through weak spots in the outer layers of the colon. You may have a few pouches or many of them. The pouches usually do not cause problems unless they become inflamed or infected. When this happens, the condition is called diverticulitis. What are the causes? The cause of this condition is not known. What increases the risk? The following factors may make you more likely to develop this condition:  Being older than age 64. Your risk for this condition increases with age. Diverticulosis is rare  among people younger than age 75. By age 21, many people have it.  Eating a low-fiber diet.  Having frequent constipation.  Being overweight.  Not getting enough exercise.  Smoking.  Taking over-the-counter pain medicines, like aspirin and ibuprofen.  Having a family history of diverticulosis. What are the signs or symptoms? In most people, there are no symptoms of this condition. If you do have symptoms, they may include:  Bloating.  Cramps in the abdomen.  Constipation or  diarrhea.  Pain in the lower left side of the abdomen. How is this diagnosed? Because diverticulosis usually has no symptoms, it is most often diagnosed during an exam for other colon problems. The condition may be diagnosed by:  Using a flexible scope to examine the colon (colonoscopy).  Taking an X-ray of the colon after dye has been put into the colon (barium enema).  Having a CT scan. How is this treated? You may not need treatment for this condition. Your health care provider may recommend treatment to prevent problems. You may need treatment if you have symptoms or if you previously had diverticulitis. Treatment may include:  Eating a high-fiber diet.  Taking a fiber supplement.  Taking a live bacteria supplement (probiotic).  Taking medicine to relax your colon.   Follow these instructions at home: Medicines  Take over-the-counter and prescription medicines only as told by your health care provider.  If told by your health care provider, take a fiber supplement or probiotic. Constipation prevention Your condition may cause constipation. To prevent or treat constipation, you may need to:  Drink enough fluid to keep your urine pale yellow.  Take over-the-counter or prescription medicines.  Eat foods that are high in fiber, such as beans, whole grains, and fresh fruits and vegetables.  Limit foods that are high in fat and processed sugars, such as fried or sweet foods.   General instructions  Try not to strain when you have a bowel movement.  Keep all follow-up visits as told by your health care provider. This is important. Contact a health care provider if you:  Have pain in your abdomen.  Have bloating.  Have cramps.  Have not had a bowel movement in 3 days. Get help right away if:  Your pain gets worse.  Your bloating becomes very bad.  You have a fever or chills, and your symptoms suddenly get worse.  You vomit.  You have bowel movements that are  bloody or black.  You have bleeding from your rectum. Summary  Diverticulosis is a condition that develops when small pouches (diverticula) form in the wall of the large intestine (colon).  You may have a few pouches or many of them.  This condition is most often diagnosed during an exam for other colon problems.  Treatment may include increasing the fiber in your diet, taking supplements, or taking medicines. This information is not intended to replace advice given to you by your health care provider. Make sure you discuss any questions you have with your health care provider. Document Revised: 10/21/2018 Document Reviewed: 10/21/2018 Elsevier Patient Education  [place].

## 2020-06-28 NOTE — Anesthesia Preprocedure Evaluation (Signed)
Anesthesia Evaluation  Patient identified by MRN, date of birth, ID band Patient awake    Reviewed: Allergy & Precautions, H&P , NPO status , Patient's Chart, lab work & pertinent test results, reviewed documented beta blocker date and time   History of Anesthesia Complications (+) PONV and history of anesthetic complications  Airway Mallampati: II  TM Distance: >3 FB Neck ROM: full    Dental no notable dental hx.    Pulmonary neg pulmonary ROS,    Pulmonary exam normal breath sounds clear to auscultation       Cardiovascular Exercise Tolerance: Good hypertension, negative cardio ROS   Rhythm:regular Rate:Normal     Neuro/Psych negative neurological ROS  negative psych ROS   GI/Hepatic negative GI ROS, Neg liver ROS,   Endo/Other  Hypothyroidism   Renal/GU negative Renal ROS  negative genitourinary   Musculoskeletal   Abdominal   Peds  Hematology  (+) Blood dyscrasia, anemia ,   Anesthesia Other Findings   Reproductive/Obstetrics negative OB ROS                             Anesthesia Physical Anesthesia Plan  ASA: II  Anesthesia Plan: General   Post-op Pain Management:    Induction:   PONV Risk Score and Plan: Propofol infusion  Airway Management Planned:   Additional Equipment:   Intra-op Plan:   Post-operative Plan:   Informed Consent: I have reviewed the patients History and Physical, chart, labs and discussed the procedure including the risks, benefits and alternatives for the proposed anesthesia with the patient or authorized representative who has indicated his/her understanding and acceptance.     Dental Advisory Given  Plan Discussed with: CRNA  Anesthesia Plan Comments:         Anesthesia Quick Evaluation

## 2020-07-05 ENCOUNTER — Encounter (HOSPITAL_COMMUNITY): Payer: Self-pay | Admitting: Internal Medicine

## 2020-07-17 DIAGNOSIS — Z23 Encounter for immunization: Secondary | ICD-10-CM | POA: Diagnosis not present

## 2020-07-27 DIAGNOSIS — J069 Acute upper respiratory infection, unspecified: Secondary | ICD-10-CM | POA: Diagnosis not present

## 2020-07-27 DIAGNOSIS — J019 Acute sinusitis, unspecified: Secondary | ICD-10-CM | POA: Diagnosis not present

## 2020-07-27 DIAGNOSIS — J209 Acute bronchitis, unspecified: Secondary | ICD-10-CM | POA: Diagnosis not present

## 2020-07-27 DIAGNOSIS — B001 Herpesviral vesicular dermatitis: Secondary | ICD-10-CM | POA: Diagnosis not present

## 2020-11-29 DIAGNOSIS — H1013 Acute atopic conjunctivitis, bilateral: Secondary | ICD-10-CM | POA: Diagnosis not present

## 2020-11-29 DIAGNOSIS — H04123 Dry eye syndrome of bilateral lacrimal glands: Secondary | ICD-10-CM | POA: Diagnosis not present

## 2020-12-27 DIAGNOSIS — Z23 Encounter for immunization: Secondary | ICD-10-CM | POA: Diagnosis not present

## 2020-12-27 DIAGNOSIS — U071 COVID-19: Secondary | ICD-10-CM | POA: Diagnosis not present

## 2021-01-21 DIAGNOSIS — Z23 Encounter for immunization: Secondary | ICD-10-CM | POA: Diagnosis not present

## 2021-08-01 DIAGNOSIS — E782 Mixed hyperlipidemia: Secondary | ICD-10-CM | POA: Diagnosis not present

## 2021-08-01 DIAGNOSIS — E039 Hypothyroidism, unspecified: Secondary | ICD-10-CM | POA: Diagnosis not present

## 2021-08-01 DIAGNOSIS — E6609 Other obesity due to excess calories: Secondary | ICD-10-CM | POA: Diagnosis not present

## 2021-08-01 DIAGNOSIS — Z6833 Body mass index (BMI) 33.0-33.9, adult: Secondary | ICD-10-CM | POA: Diagnosis not present

## 2021-08-01 DIAGNOSIS — Z0001 Encounter for general adult medical examination with abnormal findings: Secondary | ICD-10-CM | POA: Diagnosis not present

## 2021-08-01 DIAGNOSIS — Z1331 Encounter for screening for depression: Secondary | ICD-10-CM | POA: Diagnosis not present

## 2021-08-01 DIAGNOSIS — I10 Essential (primary) hypertension: Secondary | ICD-10-CM | POA: Diagnosis not present

## 2021-12-27 DIAGNOSIS — Z23 Encounter for immunization: Secondary | ICD-10-CM | POA: Diagnosis not present

## 2022-01-02 DIAGNOSIS — Z23 Encounter for immunization: Secondary | ICD-10-CM | POA: Diagnosis not present

## 2022-03-20 DIAGNOSIS — J019 Acute sinusitis, unspecified: Secondary | ICD-10-CM | POA: Diagnosis not present

## 2022-03-20 DIAGNOSIS — J069 Acute upper respiratory infection, unspecified: Secondary | ICD-10-CM | POA: Diagnosis not present

## 2022-06-03 DIAGNOSIS — J069 Acute upper respiratory infection, unspecified: Secondary | ICD-10-CM | POA: Diagnosis not present

## 2022-06-03 DIAGNOSIS — J209 Acute bronchitis, unspecified: Secondary | ICD-10-CM | POA: Diagnosis not present

## 2022-06-03 DIAGNOSIS — J019 Acute sinusitis, unspecified: Secondary | ICD-10-CM | POA: Diagnosis not present

## 2022-07-29 ENCOUNTER — Telehealth: Payer: Self-pay | Admitting: *Deleted

## 2022-07-29 NOTE — Progress Notes (Signed)
  Care Coordination  Outreach Note  07/29/2022 Name: DAMIA BOBROWSKI MRN: [id number] DOB: 1950-12-10   Care Coordination Outreach Attempts: An unsuccessful telephone outreach was attempted today to offer the patient information about available care coordination services as a benefit of their health plan.   Follow Up Plan:  Additional outreach attempts will be made to offer the patient care coordination information and services.   Encounter Outcome:  No Answer  Christie Nottingham  Care Coordination Care Guide  Direct Dial: [phone]

## 2022-08-05 NOTE — Progress Notes (Unsigned)
  Care Coordination  Outreach Note  08/05/2022 Name: Cindy George MRN: [id number] DOB: 15-Nov-1950   Care Coordination Outreach Attempts: A second unsuccessful outreach was attempted today to offer the patient with information about available care coordination services as a benefit of their health plan.     Follow Up Plan:  Additional outreach attempts will be made to offer the patient care coordination information and services.   Encounter Outcome:  No Answer  Christie Nottingham  Care Coordination Care Guide  Direct Dial: [phone]

## 2022-08-07 NOTE — Progress Notes (Signed)
  Care Coordination   Note   08/07/2022 Name: Cindy George MRN: [id number] DOB: 10-05-1950  Cindy George is a 72 y.o. year old female who sees Assunta Found, MD for primary care. I reached out to Cindy George by phone today to offer care coordination services.  Cindy George was given information about Care Coordination services today including:   The Care Coordination services include support from the care team which includes your Nurse Coordinator, Clinical Social Worker, or Pharmacist.  The Care Coordination team is here to help remove barriers to the health concerns and goals most important to you. Care Coordination services are voluntary, and the patient may decline or stop services at any time by request to their care team member.   Care Coordination Consent Status: Patient agreed to services and verbal consent obtained.   Follow up plan:  Telephone appointment with care coordination team member scheduled for:  10/02/22  Encounter Outcome:  Pt. Scheduled

## 2022-08-18 DIAGNOSIS — H524 Presbyopia: Secondary | ICD-10-CM | POA: Diagnosis not present

## 2022-08-18 DIAGNOSIS — H0288A Meibomian gland dysfunction right eye, upper and lower eyelids: Secondary | ICD-10-CM | POA: Diagnosis not present

## 2022-08-18 DIAGNOSIS — H0288B Meibomian gland dysfunction left eye, upper and lower eyelids: Secondary | ICD-10-CM | POA: Diagnosis not present

## 2022-08-18 DIAGNOSIS — H43812 Vitreous degeneration, left eye: Secondary | ICD-10-CM | POA: Diagnosis not present

## 2022-10-02 ENCOUNTER — Ambulatory Visit: Payer: Self-pay | Admitting: *Deleted

## 2022-10-02 NOTE — Patient Outreach (Signed)
  Care Coordination   10/02/2022 Name: Cindy George MRN: [id number] DOB: 1951/04/07   Care Coordination Outreach Attempts:  An unsuccessful telephone outreach was attempted for a scheduled appointment today.  Follow Up Plan:  Additional outreach attempts will be made to offer the patient care coordination information and services.   Encounter Outcome:  No Answer. Left HIPAA compliant VM.   Care Coordination Interventions:  Yes, provided    Demetrios Loll, BSN, RN-BC RN Care Coordinator [facility]  [facility] Direct Dial: [phone] Main #: [phone]

## 2022-10-10 ENCOUNTER — Telehealth: Payer: Self-pay | Admitting: *Deleted

## 2022-10-10 NOTE — Progress Notes (Unsigned)
  Care Coordination Note  10/10/2022 Name: [name] MRN: [id number] DOB: 1951-01-29  Cindy George is a 72 y.o. year old female who is a primary care patient of Assunta Found, MD and is actively engaged with the care management team. I reached out to Judyann Munson by phone today to assist with re-scheduling an initial visit with the RN Case Manager  Follow up plan: Unsuccessful telephone outreach attempt made. A HIPAA compliant phone message was left for the patient providing contact information and requesting a return call.   [facility]  Care Coordination Care Guide  Direct Dial: [phone]

## 2022-10-15 NOTE — Progress Notes (Signed)
  Care Coordination Note  10/15/2022 Name: Cindy George MRN: [id number] DOB: Aug 21, 1950  Cindy George is a 72 y.o. year old female who is a primary care patient of Assunta Found, MD and is actively engaged with the care management team. I reached out to Judyann Munson by phone today to assist with re-scheduling an initial visit with the RN Case Manager  Follow up plan: Unsuccessful telephone outreach attempt made. A HIPAA compliant phone message was left for the patient providing contact information and requesting a return call.  We have been unable to make contact with the patient for follow up. The care management team is available to follow up with the patient after provider conversation with the patient regarding recommendation for care management engagement and subsequent re-referral to the care management team.   [facility] Coordination Care Guide  Direct Dial: [phone]

## 2022-10-27 ENCOUNTER — Telehealth: Payer: Self-pay | Admitting: *Deleted

## 2022-10-27 NOTE — Progress Notes (Signed)
  Care Coordination   Note   10/27/2022 Name: [name] MRN: [id number] DOB: 02-Jun-1950  Cindy George is a 72 y.o. year old female who sees Assunta Found, MD for primary care. I reached out to Cindy George by phone today to offer care coordination services.  [name] was given information about Care Coordination services today including:   The Care Coordination services include support from the care team which includes your Nurse Coordinator, Clinical Social Worker, or Pharmacist.  The Care Coordination team is here to help remove barriers to the health concerns and goals most important to you. Care Coordination services are voluntary, and the patient may decline or stop services at any time by request to their care team member.   Care Coordination Consent Status: Patient agreed to services and verbal consent obtained.   Follow up plan:  Telephone appointment with care coordination team member scheduled for:  11/07/22  Encounter Outcome:  Pt. Scheduled [facility] Coordination Care Guide  Direct Dial: [phone]

## 2022-11-07 ENCOUNTER — Ambulatory Visit: Payer: Self-pay | Admitting: *Deleted

## 2022-11-07 NOTE — Patient Outreach (Signed)
  Care Coordination   11/07/2022 Name: Cindy George MRN: [id number] DOB: 09/08/1950   Care Coordination Outreach Attempts:  An unsuccessful telephone outreach was attempted for a scheduled appointment today.  Follow Up Plan:  Additional outreach attempts will be made to offer the patient care coordination information and services.   Encounter Outcome:  No Answer. Left HIPAA compliant VM.   Care Coordination Interventions:  No, not indicated    Demetrios Loll, BSN, RN-BC RN Care Coordinator [facility]  [facility] Direct Dial: [phone] Main #: [phone]

## 2022-11-13 ENCOUNTER — Telehealth: Payer: Self-pay | Admitting: *Deleted

## 2022-11-13 NOTE — Progress Notes (Signed)
  Care Coordination Note  11/13/2022 Name: MARIAUNA PROWSE MRN: [id number] DOB: October 02, 1950  Dorcas Mcmurray Sarli is a 72 y.o. year old female who is a primary care patient of Assunta Found, MD and is actively engaged with the care management team. I reached out to Judyann Munson by phone today to assist with re-scheduling a follow up visit with the RN Case Manager  Follow up plan: Unsuccessful telephone outreach attempt made. A HIPAA compliant phone message was left for the patient providing contact information and requesting a return call.   [facility]  Care Coordination Care Guide  Direct Dial: [phone]

## 2022-11-14 NOTE — Progress Notes (Signed)
  Care Coordination Note  11/14/2022 Name: Cindy George MRN: [id number] DOB: 02-19-1951  Cindy George is a 72 y.o. year old female who is a primary care patient of Assunta Found, MD and is actively engaged with the care management team. I reached out to Judyann Munson by phone today to assist with re-scheduling a follow up visit with the RN Case Manager  Follow up plan: Unsuccessful telephone outreach attempt made. A HIPAA compliant phone message was left for the patient providing contact information and requesting a return call.  We have been unable to make contact with the patient for follow up. The care management team is available to follow up with the patient after provider conversation with the patient regarding recommendation for care management engagement and subsequent re-referral to the care management team.   [facility] Coordination Care Guide  Direct Dial: [phone]

## 2023-01-01 DIAGNOSIS — Z23 Encounter for immunization: Secondary | ICD-10-CM | POA: Diagnosis not present

## 2023-02-13 DIAGNOSIS — D3102 Benign neoplasm of left conjunctiva: Secondary | ICD-10-CM | POA: Diagnosis not present

## 2023-02-13 DIAGNOSIS — D23122 Other benign neoplasm of skin of left lower eyelid, including canthus: Secondary | ICD-10-CM | POA: Diagnosis not present

## 2023-03-09 DIAGNOSIS — H1131 Conjunctival hemorrhage, right eye: Secondary | ICD-10-CM | POA: Diagnosis not present

## 2023-03-09 DIAGNOSIS — D23122 Other benign neoplasm of skin of left lower eyelid, including canthus: Secondary | ICD-10-CM | POA: Diagnosis not present

## 2023-03-11 DIAGNOSIS — R350 Frequency of micturition: Secondary | ICD-10-CM | POA: Diagnosis not present

## 2023-03-11 DIAGNOSIS — R319 Hematuria, unspecified: Secondary | ICD-10-CM | POA: Diagnosis not present

## 2023-03-11 DIAGNOSIS — N390 Urinary tract infection, site not specified: Secondary | ICD-10-CM | POA: Diagnosis not present

## 2023-03-11 DIAGNOSIS — R309 Painful micturition, unspecified: Secondary | ICD-10-CM | POA: Diagnosis not present

## 2023-03-20 DIAGNOSIS — J069 Acute upper respiratory infection, unspecified: Secondary | ICD-10-CM | POA: Diagnosis not present

## 2023-03-20 DIAGNOSIS — E782 Mixed hyperlipidemia: Secondary | ICD-10-CM | POA: Diagnosis not present

## 2023-03-20 DIAGNOSIS — E7849 Other hyperlipidemia: Secondary | ICD-10-CM | POA: Diagnosis not present

## 2023-03-20 DIAGNOSIS — Z6834 Body mass index (BMI) 34.0-34.9, adult: Secondary | ICD-10-CM | POA: Diagnosis not present

## 2023-03-20 DIAGNOSIS — I10 Essential (primary) hypertension: Secondary | ICD-10-CM | POA: Diagnosis not present

## 2023-03-20 DIAGNOSIS — E039 Hypothyroidism, unspecified: Secondary | ICD-10-CM | POA: Diagnosis not present

## 2023-03-20 DIAGNOSIS — E6609 Other obesity due to excess calories: Secondary | ICD-10-CM | POA: Diagnosis not present

## 2023-04-27 DIAGNOSIS — E039 Hypothyroidism, unspecified: Secondary | ICD-10-CM | POA: Diagnosis not present

## 2023-06-04 DIAGNOSIS — E039 Hypothyroidism, unspecified: Secondary | ICD-10-CM | POA: Diagnosis not present

## 2023-07-16 ENCOUNTER — Ambulatory Visit: Admitting: Orthopaedic Surgery

## 2023-08-04 DIAGNOSIS — E039 Hypothyroidism, unspecified: Secondary | ICD-10-CM | POA: Diagnosis not present

## 2023-08-31 DIAGNOSIS — H04123 Dry eye syndrome of bilateral lacrimal glands: Secondary | ICD-10-CM | POA: Diagnosis not present

## 2023-08-31 DIAGNOSIS — H11433 Conjunctival hyperemia, bilateral: Secondary | ICD-10-CM | POA: Diagnosis not present

## 2023-09-11 DIAGNOSIS — E039 Hypothyroidism, unspecified: Secondary | ICD-10-CM | POA: Diagnosis not present

## 2023-09-14 DIAGNOSIS — H524 Presbyopia: Secondary | ICD-10-CM | POA: Diagnosis not present

## 2023-12-17 DIAGNOSIS — I10 Essential (primary) hypertension: Secondary | ICD-10-CM | POA: Diagnosis not present

## 2023-12-17 DIAGNOSIS — E039 Hypothyroidism, unspecified: Secondary | ICD-10-CM | POA: Diagnosis not present

## 2023-12-17 DIAGNOSIS — Z6834 Body mass index (BMI) 34.0-34.9, adult: Secondary | ICD-10-CM | POA: Diagnosis not present

## 2023-12-17 DIAGNOSIS — E6609 Other obesity due to excess calories: Secondary | ICD-10-CM | POA: Diagnosis not present

## 2023-12-17 DIAGNOSIS — Z1331 Encounter for screening for depression: Secondary | ICD-10-CM | POA: Diagnosis not present

## 2023-12-17 DIAGNOSIS — Z0001 Encounter for general adult medical examination with abnormal findings: Secondary | ICD-10-CM | POA: Diagnosis not present

## 2023-12-17 DIAGNOSIS — E7849 Other hyperlipidemia: Secondary | ICD-10-CM | POA: Diagnosis not present

## 2023-12-17 DIAGNOSIS — E782 Mixed hyperlipidemia: Secondary | ICD-10-CM | POA: Diagnosis not present

## 2023-12-28 DIAGNOSIS — H11441 Conjunctival cysts, right eye: Secondary | ICD-10-CM | POA: Diagnosis not present

## 2024-02-08 ENCOUNTER — Encounter: Payer: Self-pay | Admitting: Radiology
